# Patient Record
Sex: Female | Born: 1990 | Race: Black or African American | Hispanic: No | Marital: Single | Smoking: Former smoker
Health system: Southern US, Community
[De-identification: ages and names within clinical notes are randomized; demographics above are authoritative.]

## PROBLEM LIST (undated history)

## (undated) DIAGNOSIS — F419 Anxiety disorder, unspecified: Secondary | ICD-10-CM

## (undated) DIAGNOSIS — Z789 Other specified health status: Secondary | ICD-10-CM

## (undated) HISTORY — PX: NO PAST SURGERIES: SHX2092

## (undated) HISTORY — PX: OTHER SURGICAL HISTORY: SHX169

---
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
## 2009-05-29 ENCOUNTER — Emergency Department: Payer: Self-pay | Admitting: Internal Medicine

## 2009-08-25 ENCOUNTER — Emergency Department: Payer: Self-pay | Admitting: Emergency Medicine

## 2009-09-08 ENCOUNTER — Emergency Department: Payer: Self-pay | Admitting: Emergency Medicine

## 2010-05-21 ENCOUNTER — Emergency Department: Payer: Self-pay | Admitting: Internal Medicine

## 2010-09-06 ENCOUNTER — Emergency Department: Payer: Self-pay | Admitting: Emergency Medicine

## 2010-10-13 ENCOUNTER — Emergency Department: Payer: Self-pay | Admitting: Unknown Physician Specialty

## 2011-01-24 ENCOUNTER — Emergency Department: Payer: Self-pay | Admitting: *Deleted

## 2011-06-17 ENCOUNTER — Emergency Department: Payer: Self-pay | Admitting: Emergency Medicine

## 2012-02-29 ENCOUNTER — Emergency Department: Payer: Self-pay | Admitting: Emergency Medicine

## 2012-02-29 LAB — RAPID INFLUENZA A&B ANTIGENS

## 2012-04-18 ENCOUNTER — Encounter (HOSPITAL_COMMUNITY): Payer: Self-pay | Admitting: Emergency Medicine

## 2012-04-18 ENCOUNTER — Emergency Department (HOSPITAL_COMMUNITY)
Admission: EM | Admit: 2012-04-18 | Discharge: 2012-04-18 | Disposition: A | Discharge status: Home / Self Care | Payer: Self-pay | Attending: Emergency Medicine | Admitting: Emergency Medicine

## 2012-04-18 DIAGNOSIS — IMO0001 Reserved for inherently not codable concepts without codable children: Secondary | ICD-10-CM | POA: Insufficient documentation

## 2012-04-18 DIAGNOSIS — R059 Cough, unspecified: Secondary | ICD-10-CM | POA: Insufficient documentation

## 2012-04-18 DIAGNOSIS — H9209 Otalgia, unspecified ear: Secondary | ICD-10-CM | POA: Insufficient documentation

## 2012-04-18 DIAGNOSIS — J069 Acute upper respiratory infection, unspecified: Secondary | ICD-10-CM | POA: Insufficient documentation

## 2012-04-18 DIAGNOSIS — R05 Cough: Secondary | ICD-10-CM | POA: Insufficient documentation

## 2012-04-18 LAB — RAPID STREP SCREEN (MED CTR MEBANE ONLY): Streptococcus, Group A Screen (Direct): NEGATIVE

## 2012-04-18 MED ORDER — IBUPROFEN 200 MG PO TABS
600.0000 mg | ORAL_TABLET | Freq: Once | ORAL | Status: AC
  Administered 2012-04-18: 600 mg via ORAL
  Filled 2012-04-18: qty 3
  Filled 2012-04-18: qty 1

## 2012-04-18 MED ORDER — IBUPROFEN 600 MG PO TABS: 600.0000 mg | ORAL_TABLET | Freq: Four times a day (QID) | ORAL | Status: DC | PRN

## 2012-04-18 NOTE — ED Notes (Signed)
Pt alert, arrives from home, c/o sore throat, onset was earlier today, resp even unlabored, skin pwd, denies recent ill contacts or exposures

## 2012-04-18 NOTE — ED Notes (Signed)
Pt ambulating independently w/ steady gait on d/c in no acute distress, A&Ox4. D/c instructions reviewed w/ pt and family - pt and family deny any further questions or concerns at present. Rx given x1  

## 2012-04-18 NOTE — ED Provider Notes (Signed)
History    This chart was scribed for non-physician practitioner Breanna Crigler, PA working with [name], MD by Magnus Sinning, ED Scribe. This patient was seen in room WTR6/WTR6 and the patient's care was started at 22:40.    CSN: [id number]  Arrival date & time 04/18/12  2211   Chief Complaint  Patient presents with  . Sore Throat    (Consider location/radiation/quality/duration/timing/severity/associated sxs/prior treatment) Patient is a 22 y.o. female presenting with pharyngitis. The history is provided by the patient. No language interpreter was used.  Sore Throat Pertinent negatives include no abdominal pain and no headaches.   Breanna Lawrence is a 22 y.o. female who presents to the Emergency Department complaining of gradually worsening moderate ST, onset this afternoon with associated otalgias, myalgias, and dry unproductive cough.  Pt states she had sudden onset mild otalgias this morning upon waking up, gradually worsening during day along with ST. She states she has not taken any medications and she denies fevers, n/v/d, urinary sxs.  The onset of this condition was acute. The course is constant. Aggravating factors: none. Alleviating factors: none.    History reviewed. No pertinent past medical history.  History reviewed. No pertinent past surgical history.  No family history on file.  History  Substance Use Topics  . Smoking status: Never Smoker   . Smokeless tobacco: Not on file  . Alcohol Use: No    Review of Systems  Constitutional: Negative for fever, chills and fatigue.  HENT: Positive for ear pain and sore throat. Negative for congestion, rhinorrhea, neck stiffness and sinus pressure.   Eyes: Negative for redness.  Respiratory: Positive for cough. Negative for wheezing.   Gastrointestinal: Negative for nausea, vomiting, abdominal pain and diarrhea.  Genitourinary: Negative.   Musculoskeletal: Positive for myalgias.  Skin: Negative for rash.   Neurological: Negative for headaches.  Hematological: Negative for adenopathy.    Allergies  Review of patient's allergies indicates no known allergies.  Home Medications  No current outpatient prescriptions on file.  BP 96/65  Pulse 98  Temp(Src) 99 F (37.2 C) (Oral)  Resp 16  Wt 120 lb (54.432 kg)  SpO2 100%  Physical Exam  Nursing note and vitals reviewed. Constitutional: She is oriented to person, place, and time. She appears well-developed and well-nourished. No distress.  HENT:  Head: Normocephalic and atraumatic. No trismus in the jaw.  Right Ear: Tympanic membrane, external ear and ear canal normal.  Left Ear: Tympanic membrane, external ear and ear canal normal.  Nose: Nose normal. No mucosal edema or rhinorrhea.  Mouth/Throat: Uvula is midline, oropharynx is clear and moist and mucous membranes are normal. Mucous membranes are not dry. No oral lesions. No edematous. No oropharyngeal exudate, posterior oropharyngeal edema, posterior oropharyngeal erythema or tonsillar abscesses.  Mild erythema of the posterior oropharynx.  Eyes: Conjunctivae and EOM are normal. Right eye exhibits no discharge. Left eye exhibits no discharge.  Neck: Normal range of motion. Neck supple. No tracheal deviation present.  No lymphadenopathy   Cardiovascular: Normal rate, regular rhythm and normal heart sounds.   Pulmonary/Chest: Effort normal and breath sounds normal. No respiratory distress. She has no wheezes. She has no rales.  Abdominal: Soft. She exhibits no distension. There is no tenderness.  Musculoskeletal: Normal range of motion.  Lymphadenopathy:    She has no cervical adenopathy.  Neurological: She is alert and oriented to person, place, and time. No sensory deficit.  Skin: Skin is warm and dry.  Psychiatric: She has a normal mood  and affect. Her behavior is normal.    ED Course  Procedures (including critical care time) DIAGNOSTIC STUDIES: Oxygen Saturation is 100% on  room air, normal by my interpretation.    COORDINATION OF CARE: 22:41: Physical exam performed.  Labs Reviewed  RAPID STREP SCREEN   No results found.   1. Upper respiratory infection     Patient seen and examined. Strep neg.  Vital signs reviewed and are as follows: Filed Vitals:   04/18/12 2226  BP: 96/65  Pulse: 98  Temp: 99 F (37.2 C)  Resp: 16   Patient counseled on supportive care for viral URI and s/s to return including worsening symptoms, persistent fever, persistent vomiting, or if they have any other concerns.  Urged to see PCP if symptoms persist for more than 3 days. Patient verbalizes understanding and agrees with plan.     MDM  Patient with symptoms consistent with a viral syndrome.  Vitals are stable, no fever.  No signs of dehydration.  Lung exam normal, no signs of pneumonia.  Supportive therapy indicated with return if symptoms worsen.    I personally performed the services described in this documentation, which was scribed in my presence. The recorded information has been reviewed and is accurate.         Breanna Lawrence, [place] 04/19/12 0100

## 2012-04-19 NOTE — ED Provider Notes (Signed)
Medical screening examination/treatment/procedure(s) were performed by non-physician practitioner and as supervising physician I was immediately available for consultation/collaboration.  [name], MD 04/19/12 1725

## 2012-10-20 ENCOUNTER — Emergency Department: Payer: Self-pay | Admitting: Internal Medicine

## 2013-08-19 ENCOUNTER — Emergency Department: Payer: Self-pay | Admitting: Emergency Medicine

## 2014-02-28 ENCOUNTER — Emergency Department: Payer: Self-pay | Admitting: Emergency Medicine

## 2014-05-02 ENCOUNTER — Emergency Department: Payer: Self-pay | Admitting: Emergency Medicine

## 2014-06-18 ENCOUNTER — Emergency Department
Admit: 2014-06-18 | Disposition: A | Discharge status: Home / Self Care | Payer: Self-pay | Admitting: Physician Assistant

## 2014-06-18 LAB — CBC WITH DIFFERENTIAL/PLATELET
BASOS ABS: 0 10*3/uL (ref 0.0–0.1)
BASOS PCT: 0.3 %
Eosinophil #: 0.1 10*3/uL (ref 0.0–0.7)
Eosinophil %: 1.3 %
HCT: 37.5 % (ref 35.0–47.0)
HGB: 12.5 g/dL (ref 12.0–16.0)
LYMPHS PCT: 16.1 %
Lymphocyte #: 1 10*3/uL (ref 1.0–3.6)
MCH: 29.4 pg (ref 26.0–34.0)
MCHC: 33.2 g/dL (ref 32.0–36.0)
MCV: 89 fL (ref 80–100)
MONOS PCT: 6.4 %
Monocyte #: 0.4 x10 3/mm (ref 0.2–0.9)
NEUTROS ABS: 4.9 10*3/uL (ref 1.4–6.5)
Neutrophil %: 75.9 %
Platelet: 191 10*3/uL (ref 150–440)
RBC: 4.24 10*6/uL (ref 3.80–5.20)
RDW: 14.1 % (ref 11.5–14.5)
WBC: 6.4 10*3/uL (ref 3.6–11.0)

## 2014-06-18 LAB — COMPREHENSIVE METABOLIC PANEL
ALBUMIN: 4.7 g/dL
ALK PHOS: 50 U/L
ANION GAP: 7 (ref 7–16)
BUN: 14 mg/dL
Bilirubin,Total: 0.5 mg/dL
CHLORIDE: 105 mmol/L
CO2: 28 mmol/L
CREATININE: 0.95 mg/dL
Calcium, Total: 9.2 mg/dL
GLUCOSE: 106 mg/dL — AB
Potassium: 3.5 mmol/L
SGOT(AST): 24 U/L
SGPT (ALT): 12 U/L — ABNORMAL LOW
Sodium: 140 mmol/L
TOTAL PROTEIN: 7.7 g/dL

## 2014-06-18 LAB — URINALYSIS, COMPLETE
BILIRUBIN, UR: NEGATIVE
BLOOD: NEGATIVE
Bacteria: NONE SEEN
GLUCOSE, UR: NEGATIVE mg/dL (ref 0–75)
KETONE: NEGATIVE
Leukocyte Esterase: NEGATIVE
NITRITE: NEGATIVE
PH: 5 (ref 4.5–8.0)
SPECIFIC GRAVITY: 1.019 (ref 1.003–1.030)

## 2014-11-24 ENCOUNTER — Emergency Department
Admission: EM | Admit: 2014-11-24 | Discharge: 2014-11-24 | Disposition: A | Discharge status: Home / Self Care | Payer: Self-pay | Attending: Emergency Medicine | Admitting: Emergency Medicine

## 2014-11-24 ENCOUNTER — Encounter: Payer: Self-pay | Admitting: Emergency Medicine

## 2014-11-24 DIAGNOSIS — K529 Noninfective gastroenteritis and colitis, unspecified: Secondary | ICD-10-CM | POA: Insufficient documentation

## 2014-11-24 DIAGNOSIS — Z3202 Encounter for pregnancy test, result negative: Secondary | ICD-10-CM | POA: Insufficient documentation

## 2014-11-24 LAB — URINALYSIS COMPLETE WITH MICROSCOPIC (ARMC ONLY)
Bilirubin Urine: NEGATIVE
Glucose, UA: NEGATIVE mg/dL
Hgb urine dipstick: NEGATIVE
KETONES UR: NEGATIVE mg/dL
Leukocytes, UA: NEGATIVE
Nitrite: NEGATIVE
PH: 5 (ref 5.0–8.0)
PROTEIN: NEGATIVE mg/dL
Specific Gravity, Urine: 1.02 (ref 1.005–1.030)

## 2014-11-24 LAB — COMPREHENSIVE METABOLIC PANEL
ALBUMIN: 4.3 g/dL (ref 3.5–5.0)
ALK PHOS: 51 U/L (ref 38–126)
ALT: 14 U/L (ref 14–54)
AST: 29 U/L (ref 15–41)
Anion gap: 7 (ref 5–15)
BUN: 9 mg/dL (ref 6–20)
CALCIUM: 9.4 mg/dL (ref 8.9–10.3)
CO2: 27 mmol/L (ref 22–32)
CREATININE: 0.82 mg/dL (ref 0.44–1.00)
Chloride: 106 mmol/L (ref 101–111)
GFR calc Af Amer: >60 mL/min (ref >60)
GFR calc non Af Amer: >60 mL/min (ref >60)
GLUCOSE: 114 mg/dL — AB (ref 65–99)
Potassium: 3.4 mmol/L — ABNORMAL LOW (ref 3.5–5.1)
SODIUM: 140 mmol/L (ref 135–145)
Total Bilirubin: 0.8 mg/dL (ref 0.3–1.2)
Total Protein: 7 g/dL (ref 6.5–8.1)

## 2014-11-24 LAB — PREGNANCY, URINE: Preg Test, Ur: NEGATIVE

## 2014-11-24 LAB — CBC WITH DIFFERENTIAL/PLATELET
BASOS PCT: 1 %
Basophils Absolute: 0 10*3/uL (ref 0–0.1)
EOS ABS: 0.1 10*3/uL (ref 0–0.7)
Eosinophils Relative: 1 %
HEMATOCRIT: 35.1 % (ref 35.0–47.0)
HEMOGLOBIN: 11.6 g/dL — AB (ref 12.0–16.0)
LYMPHS ABS: 1.6 10*3/uL (ref 1.0–3.6)
Lymphocytes Relative: 36 %
MCH: 29.2 pg (ref 26.0–34.0)
MCHC: 33 g/dL (ref 32.0–36.0)
MCV: 88.5 fL (ref 80.0–100.0)
Monocytes Absolute: 0.4 10*3/uL (ref 0.2–0.9)
Monocytes Relative: 8 %
NEUTROS ABS: 2.5 10*3/uL (ref 1.4–6.5)
NEUTROS PCT: 54 %
Platelets: 195 10*3/uL (ref 150–440)
RBC: 3.96 MIL/uL (ref 3.80–5.20)
RDW: 13.9 % (ref 11.5–14.5)
WBC: 4.5 10*3/uL (ref 3.6–11.0)

## 2014-11-24 LAB — LIPASE, BLOOD: Lipase: 19 U/L — ABNORMAL LOW (ref 22–51)

## 2014-11-24 MED ORDER — ONDANSETRON HCL 4 MG/2ML IJ SOLN
4.0000 mg | Freq: Once | INTRAMUSCULAR | Status: AC
  Administered 2014-11-24: 4 mg via INTRAVENOUS
  Filled 2014-11-24: qty 2

## 2014-11-24 MED ORDER — SODIUM CHLORIDE 0.9 % IV SOLN
1000.0000 mL | Freq: Once | INTRAVENOUS | Status: AC
  Administered 2014-11-24: 1000 mL via INTRAVENOUS

## 2014-11-24 MED ORDER — PROMETHAZINE HCL 25 MG PO TABS: 25.0000 mg | ORAL_TABLET | Freq: Four times a day (QID) | ORAL | Status: DC | PRN

## 2014-11-24 MED ORDER — MORPHINE SULFATE (PF) 4 MG/ML IV SOLN
4.0000 mg | Freq: Once | INTRAVENOUS | Status: AC
  Administered 2014-11-24: 4 mg via INTRAVENOUS
  Filled 2014-11-24: qty 1

## 2014-11-24 NOTE — Discharge Instructions (Signed)

## 2014-11-24 NOTE — ED Provider Notes (Signed)
[facility] Emergency Department Provider Note     Time seen: ----------------------------------------- 8:33 AM on 11/24/2014 -----------------------------------------    I have reviewed the triage vital signs and the nursing notes.   HISTORY  Chief Complaint Abdominal Pain; Emesis; and Diarrhea    HPI Breanna Lawrence is a 24 y.o. female who presents ER for abdominal pain with nausea vomiting diarrhea since early this morning. Patient states she has been vomiting all night binges had 3 episodes of diarrhea. Having diffuse abdominal pain, nothing makes it better or worse. Patient states she was at work and had to stop working due to the stomach upset. History reviewed. No pertinent past medical history.  There are no active problems to display for this patient.   History reviewed. No pertinent past surgical history.  Allergies Review of patient's allergies indicates no known allergies.  Social History Social History  Substance Use Topics  . Smoking status: Never Smoker   . Smokeless tobacco: None  . Alcohol Use: No    Review of Systems Constitutional: Negative for fever. Eyes: Negative for visual changes. ENT: Negative for sore throat. Cardiovascular: Negative for chest pain. Respiratory: Negative for shortness of breath. Gastrointestinal: Positive for abdominal pain, vomiting and diarrhea. Genitourinary: Negative for dysuria. Musculoskeletal: Negative for back pain. Skin: Negative for rash. Neurological: Negative for headaches, focal weakness or numbness.  10-point ROS otherwise negative.  ____________________________________________   PHYSICAL EXAM:  VITAL SIGNS: ED Triage Vitals  Enc Vitals Group     BP 11/24/14 0832 115/61 mmHg     Pulse Rate 11/24/14 0832 73     Resp 11/24/14 0832 18     Temp 11/24/14 0832 97.6 F (36.4 C)     Temp Source 11/24/14 0832 Oral     SpO2 11/24/14 0832 97 %     Weight 11/24/14 0832 110 lb  (49.896 kg)     Height 11/24/14 0832  (1.651 m)     Head Cir --      Peak Flow --      Pain Score 11/24/14 0827 9     Pain Loc --      Pain Edu? --      Excl. in GC? --     Constitutional: Alert and oriented. Mild distress Eyes: Conjunctivae are normal. PERRL. Normal extraocular movements. ENT   Head: Normocephalic and atraumatic.   Nose: No congestion/rhinnorhea.   Mouth/Throat: Mucous membranes are moist.   Neck: No stridor. Cardiovascular: Normal rate, regular rhythm. Normal and symmetric distal pulses are present in all extremities. No murmurs, rubs, or gallops. Respiratory: Normal respiratory effort without tachypnea nor retractions. Breath sounds are clear and equal bilaterally. No wheezes/rales/rhonchi. Gastrointestinal: Nonfocal Abdominal tenderness, no rebound or guarding. Normal bowel sounds. Musculoskeletal: Nontender with normal range of motion in all extremities. No joint effusions.  No lower extremity tenderness nor edema. Neurologic:  Normal speech and language. No gross focal neurologic deficits are appreciated. Speech is normal. No gait instability. Skin:  Skin is warm, dry and intact. No rash noted. Psychiatric: Mood and affect are normal. Speech and behavior are normal. Patient exhibits appropriate insight and judgment. ____________________________________________  ED COURSE:  Pertinent labs & imaging results that were available during my care of the patient were reviewed by me and considered in my medical decision making (see chart for details). Patient is in no acute distress, likely gastroenteritis. We'll give fluids, antiemetics and pain medication. ____________________________________________    LABS (pertinent positives/negatives)  Labs Reviewed  CBC  WITH DIFFERENTIAL/PLATELET - Abnormal; Notable for the following:    Hemoglobin 11.6 (*)    All other components within normal limits  COMPREHENSIVE METABOLIC PANEL - Abnormal; Notable for  the following:    Potassium 3.4 (*)    Glucose, Bld 114 (*)    All other components within normal limits  LIPASE, BLOOD - Abnormal; Notable for the following:    Lipase 19 (*)    All other components within normal limits  URINALYSIS COMPLETEWITH MICROSCOPIC ([facility] ONLY) - Abnormal; Notable for the following:    Color, Urine YELLOW (*)    APPearance HAZY (*)    Bacteria, UA RARE (*)    Squamous Epithelial / LPF 6-30 (*)    All other components within normal limits  PREGNANCY, URINE    ____________________________________________  FINAL ASSESSMENT AND PLAN  Gastroenteritis  Plan: Patient with labs as dictated above. Patient feeling better after morphine and Zofran. She'll be discharged with Phenergan to take as needed. She is given a work note for 24 hours.   Emily Filbert, MD   Emily Filbert, MD 11/24/14 [phone]

## 2014-11-24 NOTE — ED Notes (Signed)
Patient to ER for c/o upper to mid abdominal pain with N/V/D since early this am.

## 2015-04-05 ENCOUNTER — Emergency Department
Admission: EM | Admit: 2015-04-05 | Discharge: 2015-04-05 | Disposition: A | Discharge status: Home / Self Care | Payer: Self-pay | Attending: Emergency Medicine | Admitting: Emergency Medicine

## 2015-04-05 DIAGNOSIS — R21 Rash and other nonspecific skin eruption: Secondary | ICD-10-CM | POA: Insufficient documentation

## 2015-04-05 NOTE — ED Notes (Signed)
Pt in with red area to left upper arm, pt unsure of insect bite.

## 2015-04-20 ENCOUNTER — Encounter (HOSPITAL_COMMUNITY): Payer: Self-pay | Admitting: Emergency Medicine

## 2015-04-20 ENCOUNTER — Emergency Department (HOSPITAL_COMMUNITY)
Admission: EM | Admit: 2015-04-20 | Discharge: 2015-04-20 | Disposition: A | Discharge status: Home / Self Care | Payer: BLUE CROSS/BLUE SHIELD | Source: Home / Self Care | Attending: Family Medicine | Admitting: Family Medicine

## 2015-04-20 ENCOUNTER — Emergency Department (INDEPENDENT_AMBULATORY_CARE_PROVIDER_SITE_OTHER): Payer: BLUE CROSS/BLUE SHIELD

## 2015-04-20 DIAGNOSIS — S90121A Contusion of right lesser toe(s) without damage to nail, initial encounter: Secondary | ICD-10-CM

## 2015-04-20 MED ORDER — TRAMADOL-ACETAMINOPHEN 37.5-325 MG PO TABS: 1.0000 | ORAL_TABLET | Freq: Four times a day (QID) | ORAL | Status: DC | PRN

## 2015-04-20 NOTE — ED Provider Notes (Signed)
CSN: [id number]     Arrival date & time 04/20/15  1512 History   First MD Initiated Contact with Patient 04/20/15 1637     Chief Complaint  Patient presents with  . Foot Pain   (Consider location/radiation/quality/duration/timing/severity/associated sxs/prior Treatment) HPI Comments: 25 year old female was ascending steps last night and accidentally jammed her right great toe into the wall of the step. She woke this morning with tenderness, minor swelling and ecchymosis to the right great toe involving the IP joint and distally.   History reviewed. No pertinent past medical history. History reviewed. No pertinent past surgical history. No family history on file. Social History  Substance Use Topics  . Smoking status: Never Smoker   . Smokeless tobacco: None  . Alcohol Use: No   OB History    No data available     Review of Systems  Constitutional: Negative for fever, chills and activity change.  HENT: Negative.   Respiratory: Negative.   Cardiovascular: Negative.   Musculoskeletal:       As per HPI  Skin: Negative for color change, pallor and rash.  Neurological: Negative.     Allergies  Review of patient's allergies indicates no known allergies.  Home Medications   Prior to Admission medications   Medication Sig Start Date End Date Taking? Authorizing Provider  ibuprofen (ADVIL,MOTRIN) 600 MG tablet Take 1 tablet (600 mg total) by mouth every 6 (six) hours as needed for pain. 04/18/12   [name], PA-C  promethazine (PHENERGAN) 25 MG tablet Take 1 tablet (25 mg total) by mouth every 6 (six) hours as needed for nausea or vomiting. Patient not taking: Reported on 04/20/2015 11/24/14   Emily Filbert, MD  traMADol-acetaminophen (ULTRACET) 37.5-325 MG tablet Take 1 tablet by mouth every 6 (six) hours as needed. 04/20/15   Hayden Rasmussen, NP   Meds Ordered and Administered this Visit  Medications - No data to display  BP 112/74 mmHg  Pulse 91  Temp(Src) 98.6 F (37  C) (Oral)  Resp 16  SpO2 100%  LMP 04/04/2015 No data found.   Physical Exam  Constitutional: She is oriented to person, place, and time. She appears well-developed and well-nourished. No distress.  Eyes: EOM are normal.  Neck: Normal range of motion. Neck supple.  Cardiovascular: Normal rate.   Pulmonary/Chest: Effort normal. No respiratory distress.  Musculoskeletal: She exhibits no edema.  Tenderness to the right great toe at the IP joint and distally. Mild swelling and ecchymosis. Extension is complete flexion is limited due to pain. No deformities. Distal sensation is intact.  Neurological: She is alert and oriented to person, place, and time. She exhibits normal muscle tone.  Skin: Skin is warm and dry.  Psychiatric: She has a normal mood and affect.  Nursing note and vitals reviewed.   ED Course  Procedures (including critical care time)  Labs Review Labs Reviewed - No data to display  Imaging Review Dg Toe Great Right  04/20/2015  CLINICAL DATA:  Patient stumped the right great toe last night going up steps. Pain and swelling. EXAM: RIGHT GREAT TOE COMPARISON:  None. FINDINGS: There is no evidence of fracture or dislocation. There is no evidence of arthropathy or other focal bone abnormality. Soft tissues are unremarkable. IMPRESSION: Negative. Electronically Signed   By: Norva Pavlov M.D.   On: 04/20/2015 17:04     Visual Acuity Review  Right Eye Distance:   Left Eye Distance:   Bilateral Distance:    Right Eye Near:  Left Eye Near:    Bilateral Near:         MDM   1. Toe contusion, right, initial encounter    Ultracet and motrin 600 mg for pain Ice, and elevation Tape toes together.     Hayden Rasmussen, NP 04/20/15 1734

## 2015-04-20 NOTE — ED Notes (Signed)
Ice pack

## 2015-04-20 NOTE — ED Notes (Signed)
Patient transported to X-ray 

## 2015-04-20 NOTE — ED Notes (Signed)
Patient reports running up stairs barefoot last night.  In the process, jammed right great toe. Toe is swollen, painful and has visible bruising around the toe, on top and underneath

## 2015-04-20 NOTE — Discharge Instructions (Signed)
Contusion Ultracet and motrin 600 mg for pain Ice, and elevation Tape toes together. A contusion is a deep bruise. Contusions happen when an injury causes bleeding under the skin. Symptoms of bruising include pain, swelling, and discolored skin. The skin may turn blue, purple, or yellow. HOME CARE   Rest the injured area.  If told, put ice on the injured area.  Put ice in a plastic bag.  Place a towel between your skin and the bag.  Leave the ice on for 20 minutes, 2-3 times per day.  If told, put light pressure (compression) on the injured area using an elastic bandage. Make sure the bandage is not too tight. Remove it and put it back on as told by your doctor.  If possible, raise (elevate) the injured area above the level of your heart while you are sitting or lying down.  Take over-the-counter and prescription medicines only as told by your doctor. GET HELP IF:  Your symptoms do not get better after several days of treatment.  Your symptoms get worse.  You have trouble moving the injured area. GET HELP RIGHT AWAY IF:   You have very bad pain.  You have a loss of feeling (numbness) in a hand or foot.  Your hand or foot turns pale or cold.   This information is not intended to replace advice given to you by your health care provider. Make sure you discuss any questions you have with your health care provider.   Document Released: 08/07/2007 Document Revised: 11/09/2014 Document Reviewed: 07/06/2014 Elsevier Interactive Patient Education [employer]

## 2015-04-20 NOTE — ED Notes (Signed)
Patient requesting pain medicine, notified Hayden Rasmussen, NP

## 2015-05-13 ENCOUNTER — Ambulatory Visit (INDEPENDENT_AMBULATORY_CARE_PROVIDER_SITE_OTHER): Payer: BLUE CROSS/BLUE SHIELD | Admitting: Family Medicine

## 2015-05-13 VITALS — BP 114/82 | HR 80 | Temp 98.4°F | Resp 18

## 2015-05-13 DIAGNOSIS — R109 Unspecified abdominal pain: Secondary | ICD-10-CM | POA: Diagnosis not present

## 2015-05-13 DIAGNOSIS — R112 Nausea with vomiting, unspecified: Secondary | ICD-10-CM | POA: Diagnosis not present

## 2015-05-13 DIAGNOSIS — K529 Noninfective gastroenteritis and colitis, unspecified: Secondary | ICD-10-CM

## 2015-05-13 LAB — COMPREHENSIVE METABOLIC PANEL
ALBUMIN: 4.5 g/dL (ref 3.6–5.1)
ALT: 15 U/L (ref 6–29)
AST: 24 U/L (ref 10–30)
Alkaline Phosphatase: 53 U/L (ref 33–115)
BUN: 12 mg/dL (ref 7–25)
CALCIUM: 9.7 mg/dL (ref 8.6–10.2)
CHLORIDE: 104 mmol/L (ref 98–110)
CO2: 23 mmol/L (ref 20–31)
Creat: 0.8 mg/dL (ref 0.50–1.10)
Glucose, Bld: 99 mg/dL (ref 65–99)
POTASSIUM: 4 mmol/L (ref 3.5–5.3)
Sodium: 139 mmol/L (ref 135–146)
TOTAL PROTEIN: 7.5 g/dL (ref 6.1–8.1)
Total Bilirubin: 0.4 mg/dL (ref 0.2–1.2)

## 2015-05-13 LAB — POCT URINALYSIS DIP (MANUAL ENTRY)
BILIRUBIN UA: NEGATIVE
Blood, UA: NEGATIVE
Glucose, UA: NEGATIVE
Leukocytes, UA: NEGATIVE
Nitrite, UA: NEGATIVE
PROTEIN UA: NEGATIVE
SPEC GRAV UA: 1.025
Urobilinogen, UA: 0.2
pH, UA: 6

## 2015-05-13 LAB — POCT CBC
GRANULOCYTE PERCENT: 86.4 % — AB (ref 37–80)
HEMATOCRIT: 36.6 % — AB (ref 37.7–47.9)
Hemoglobin: 13.1 g/dL (ref 12.2–16.2)
LYMPH, POC: 0.9 (ref 0.6–3.4)
MCH, POC: 31.3 pg — AB (ref 27–31.2)
MCHC: 35.7 g/dL — AB (ref 31.8–35.4)
MCV: 87.6 fL (ref 80–97)
MID (CBC): 0.5 (ref 0–0.9)
MPV: 9.1 fL (ref 0–99.8)
POC GRANULOCYTE: 9.3 — AB (ref 2–6.9)
POC LYMPH %: 8.6 % — AB (ref 10–50)
POC MID %: 5 % (ref 0–12)
Platelet Count, POC: 169 10*3/uL (ref 142–424)
RBC: 4.18 M/uL (ref 4.04–5.48)
RDW, POC: 13.7 %
WBC: 10.8 10*3/uL — AB (ref 4.6–10.2)

## 2015-05-13 LAB — POC MICROSCOPIC URINALYSIS (UMFC)

## 2015-05-13 LAB — LIPASE: LIPASE: 17 U/L (ref 7–60)

## 2015-05-13 MED ORDER — PROMETHAZINE HCL 25 MG/ML IJ SOLN
25.0000 mg | Freq: Once | INTRAMUSCULAR | Status: AC
  Administered 2015-05-13: 25 mg via INTRAMUSCULAR

## 2015-05-13 MED ORDER — ONDANSETRON 4 MG PO TBDP
8.0000 mg | ORAL_TABLET | Freq: Once | ORAL | Status: AC
  Administered 2015-05-13: 8 mg via ORAL

## 2015-05-13 MED ORDER — ONDANSETRON 8 MG PO TBDP: 8.0000 mg | ORAL_TABLET | Freq: Three times a day (TID) | ORAL | Status: DC | PRN

## 2015-05-13 NOTE — Patient Instructions (Addendum)
Because you received labwork today, you will receive an invoice from [employer]. Please contact [facility] at [phone] with questions or concerns regarding your invoice. Our billing staff will not be able to assist you with those questions.  You will be contacted with the lab results as soon as they are available. The fastest way to get your results is to activate your My Chart account. Instructions are located on the last page of this paperwork. If you have not heard from [place] regarding the results in 2 weeks, please contact this office.  Viral Gastroenteritis Viral gastroenteritis is also known as stomach flu. This condition affects the stomach and intestinal tract. It can cause sudden diarrhea and vomiting. The illness typically lasts 3 to 8 days. Most people develop an immune response that eventually gets rid of the virus. While this natural response develops, the virus can make you quite ill. CAUSES  Many different viruses can cause gastroenteritis, such as rotavirus or noroviruses. You can catch one of these viruses by consuming contaminated food or water. You may also catch a virus by sharing utensils or other personal items with an infected person or by touching a contaminated surface. SYMPTOMS  The most common symptoms are diarrhea and vomiting. These problems can cause a severe loss of body fluids (dehydration) and a body salt (electrolyte) imbalance. Other symptoms may include:  Fever.  Headache.  Fatigue.  Abdominal pain. DIAGNOSIS  Your caregiver can usually diagnose viral gastroenteritis based on your symptoms and a physical exam. A stool sample may also be taken to test for the presence of viruses or other infections. TREATMENT  This illness typically goes away on its own. Treatments are aimed at rehydration. The most serious cases of viral gastroenteritis involve vomiting so severely that you are not able to keep fluids down. In these cases, fluids must  be given through an intravenous line (IV). HOME CARE INSTRUCTIONS   Drink enough fluids to keep your urine clear or pale yellow. Drink small amounts of fluids frequently and increase the amounts as tolerated.  Ask your caregiver for specific rehydration instructions.  Avoid:  Foods high in sugar.  Alcohol.  Carbonated drinks.  Tobacco.  Juice.  Caffeine drinks.  Extremely hot or cold fluids.  Fatty, greasy foods.  Too much intake of anything at one time.  Dairy products until 24 to 48 hours after diarrhea stops.  You may consume probiotics. Probiotics are active cultures of beneficial bacteria. They may lessen the amount and number of diarrheal stools in adults. Probiotics can be found in yogurt with active cultures and in supplements.  Wash your hands well to avoid spreading the virus.  Only take over-the-counter or prescription medicines for pain, discomfort, or fever as directed by your caregiver. Do not give aspirin to children. Antidiarrheal medicines are not recommended.  Ask your caregiver if you should continue to take your regular prescribed and over-the-counter medicines.  Keep all follow-up appointments as directed by your caregiver. SEEK IMMEDIATE MEDICAL CARE IF:   You are unable to keep fluids down.  You do not urinate at least once every 6 to 8 hours.  You develop shortness of breath.  You notice blood in your stool or vomit. This may look like coffee grounds.  You have abdominal pain that increases or is concentrated in one small area (localized).  You have persistent vomiting or diarrhea.  You have a fever.  The patient is a child younger than 3 months, and he or she  has a fever.  The patient is a child older than 3 months, and he or she has a fever and persistent symptoms.  The patient is a child older than 3 months, and he or she has a fever and symptoms suddenly get worse.  The patient is a baby, and he or she has no tears when  crying. MAKE SURE YOU:   Understand these instructions.  Will watch your condition.  Will get help right away if you are not doing well or get worse.   This information is not intended to replace advice given to you by your health care provider. Make sure you discuss any questions you have with your health care provider.   Document Released: 02/18/2005 Document Revised: 05/13/2011 Document Reviewed: 12/05/2010 Elsevier Interactive Patient Education [employer]

## 2015-05-13 NOTE — Progress Notes (Signed)
Subjective:   By signing my name below, I, [name], attest that this documentation has been prepared under the direction and in the presence of [name], MD.  Electronically Signed: Andrew Auaven Small, ED Scribe. 05/13/2015. 10:26 AM.    Patient ID: Breanna Lawrence, female    DOB: 1990/04/10, 25 y.o.   MRN: [id number]  HPI   Chief Complaint  Patient presents with  . Emesis    today, unable to keep anything down  . Chills    HPI Comments: Breanna [place] is a 25 y.o. female who presents to the [facility] complaining of constant emesis that began this morning. Pt states she felt fine yesterday with no symptoms. For dinner last night she had pizza, but no suspicious foods. She drank some ginger ail this morning but no solid foods.  She reports associated abdominal pain, nausea, light headedness and dizziness. She was given pepto bismol this morning at work but vomited medication.  She denies constipation, dysuria, vaginal discharge, trouble with bowels. She denies chance of pregnancy. She is not on birth control. Pt states she is not sexually active.   There are no active problems to display for this patient.  No past medical history on file. No past surgical history on file. No Known Allergies Prior to Admission medications   Medication Sig Start Date End Date Taking? Authorizing Provider  ibuprofen (ADVIL,MOTRIN) 600 MG tablet Take 1 tablet (600 mg total) by mouth every 6 (six) hours as needed for pain. Patient not taking: Reported on 05/13/2015 04/18/12   Renne CriglerJoshua Geiple, PA-C  promethazine (PHENERGAN) 25 MG tablet Take 1 tablet (25 mg total) by mouth every 6 (six) hours as needed for nausea or vomiting. Patient not taking: Reported on 04/20/2015 11/24/14   Emily FilbertJonathan E Williams, MD  traMADol-acetaminophen (ULTRACET) 37.5-325 MG tablet Take 1 tablet by mouth every 6 (six) hours as needed. Patient not taking: Reported on 05/13/2015 04/20/15   Hayden Rasmussenavid Mabe, NP   Social  History   Social History  . Marital Status: Single    Spouse Name: N/A  . Number of Children: N/A  . Years of Education: N/A   Occupational History  . Not on file.   Social History Main Topics  . Smoking status: Current Every Day Smoker  . Smokeless tobacco: Not on file  . Alcohol Use: No  . Drug Use: Not on file  . Sexual Activity: Not on file   Other Topics Concern  . Not on file   Social History Narrative   Review of Systems  Constitutional: Positive for chills and appetite change.  Gastrointestinal: Positive for nausea, vomiting and abdominal pain. Negative for diarrhea, constipation and blood in stool.  Genitourinary: Negative for dysuria, vaginal discharge and difficulty urinating.  Neurological: Negative for dizziness and light-headedness.     Objective:   Physical Exam  Constitutional: She is oriented to person, place, and time. She appears well-developed and well-nourished. No distress.  HENT:  Head: Normocephalic and atraumatic.  Eyes: Conjunctivae and EOM are normal.  Neck: Neck supple.  Cardiovascular: Normal rate.   Pulmonary/Chest: Effort normal.  Abdominal: Bowel sounds are increased. There is generalized tenderness. There is guarding.  Diaphoretic   Musculoskeletal: Normal range of motion.  Neurological: She is alert and oriented to person, place, and time.  Skin: Skin is warm and dry.  Psychiatric: She has a normal mood and affect. Her behavior is normal.  Nursing note and vitals reviewed.   Filed Vitals:  05/13/15 0942  BP: 114/82  Pulse: 80  Temp: 98.4 F (36.9 C)  Resp: 18  SpO2: 98%   Results for orders placed or performed in visit on 05/13/15  POCT urinalysis dipstick  Result Value Ref Range   Color, UA yellow yellow   Clarity, UA hazy (A) clear   Glucose, UA negative negative   Bilirubin, UA negative negative   Ketones, POC UA trace (5) (A) negative   Spec Grav, UA 1.025    Blood, UA negative negative   pH, UA 6.0    Protein  Ur, POC negative negative   Urobilinogen, UA 0.2    Nitrite, UA Negative Negative   Leukocytes, UA Negative Negative  POCT Microscopic Urinalysis ([facility])  Result Value Ref Range   WBC,UR,HPF,POC None None WBC/hpf   RBC,UR,HPF,POC None None RBC/hpf   Bacteria Moderate (A) None, Too numerous to count   Mucus Present (A) Absent   Epithelial Cells, UR Per Microscopy Many (A) None, Too numerous to count cells/hpf  POCT CBC  Result Value Ref Range   WBC 10.8 (A) 4.6 - 10.2 K/uL   Lymph, poc 0.9 0.6 - 3.4   POC LYMPH PERCENT 8.6 (A) 10 - 50 %L   MID (cbc) 0.5 0 - 0.9   POC MID % 5.0 0 - 12 %M   POC Granulocyte 9.3 (A) 2 - 6.9   Granulocyte percent 86.4 (A) 37 - 80 %G   RBC 4.18 4.04 - 5.48 M/uL   Hemoglobin 13.1 12.2 - 16.2 g/dL   HCT, POC 04.5 (A) 40.9 - 47.9 %   MCV 87.6 80 - 97 fL   MCH, POC 31.3 (A) 27 - 31.2 pg   MCHC 35.7 (A) 31.8 - 35.4 g/dL   RDW, POC 81.1 %   Platelet Count, POC 169 142 - 424 K/uL   MPV 9.1 0 - 99.8 fL    10:43 AM- IV placement 11:00 AM- pt was unable to keep zofran down. She feels the need to urinate. Phenergan injection ordered.  11:27 AM-  Some improvement with nausea. She still has abdominal tenderness over suprapubic area and LLQ. CVA tenderness. 11:36 AM- labs discussed with patient  Assessment & Plan:   1. Abdominal pain, unspecified abdominal location   2. Nausea and vomiting, intractability of vomiting not specified, unspecified vomiting type   3. Gastroenteritis     Orders Placed This Encounter  Procedures  . Urine culture  . Comprehensive metabolic panel  . Lipase  . POCT urinalysis dipstick  . POCT Microscopic Urinalysis ([facility])  . POCT CBC    Meds ordered this encounter  Medications  . ondansetron (ZOFRAN-ODT) disintegrating tablet 8 mg    Sig:   . promethazine (PHENERGAN) injection 25 mg    Sig:   . ondansetron (ZOFRAN-ODT) 8 MG disintegrating tablet    Sig: Take 1 tablet (8 mg total) by mouth every 8 (eight) hours as needed  for nausea.    Dispense:  14 tablet    Refill:  0    I personally performed the services described in this documentation, which was scribed in my presence. The recorded information has been reviewed and considered, and addended by me as needed.  Norberto Sorenson, MD MPH

## 2015-05-14 LAB — URINE CULTURE: Colony Count: 30000

## 2017-02-18 ENCOUNTER — Ambulatory Visit
Admission: EM | Admit: 2017-02-18 | Discharge: 2017-02-18 | Disposition: A | Discharge status: Home / Self Care | Payer: BLUE CROSS/BLUE SHIELD | Attending: Family Medicine | Admitting: Family Medicine

## 2017-02-18 ENCOUNTER — Encounter: Payer: Self-pay | Admitting: Emergency Medicine

## 2017-02-18 ENCOUNTER — Other Ambulatory Visit: Payer: Self-pay

## 2017-02-18 ENCOUNTER — Telehealth: Payer: Self-pay

## 2017-02-18 DIAGNOSIS — R112 Nausea with vomiting, unspecified: Secondary | ICD-10-CM | POA: Diagnosis not present

## 2017-02-18 DIAGNOSIS — R197 Diarrhea, unspecified: Secondary | ICD-10-CM | POA: Diagnosis not present

## 2017-02-18 DIAGNOSIS — B349 Viral infection, unspecified: Secondary | ICD-10-CM | POA: Diagnosis not present

## 2017-02-18 DIAGNOSIS — J029 Acute pharyngitis, unspecified: Secondary | ICD-10-CM

## 2017-02-18 HISTORY — DX: Other specified health status: Z78.9

## 2017-02-18 LAB — RAPID STREP SCREEN (MED CTR MEBANE ONLY): STREPTOCOCCUS, GROUP A SCREEN (DIRECT): NEGATIVE

## 2017-02-18 MED ORDER — ONDANSETRON 4 MG PO TBDP: 4.0000 mg | ORAL_TABLET | Freq: Three times a day (TID) | ORAL | 0 refills | Status: DC | PRN

## 2017-02-18 MED ORDER — BENZONATATE 100 MG PO CAPS: 100.0000 mg | ORAL_CAPSULE | Freq: Three times a day (TID) | ORAL | 0 refills | Status: DC | PRN

## 2017-02-18 MED ORDER — ONDANSETRON 4 MG PO TBDP
4.0000 mg | ORAL_TABLET | Freq: Once | ORAL | Status: AC
  Administered 2017-02-18: 4 mg via ORAL

## 2017-02-18 NOTE — ED Provider Notes (Signed)
[facility] ____________________________________________  Time seen: Approximately 1145 AM  I have reviewed the triage vital signs and the nursing notes.   HISTORY  Chief Complaint Sore Throat  HPI Breanna Lawrence is a 26 y.o. female presenting for evaluation of 2 days of runny nose, nasal congestion, sore throat, cough with episodes of diarrhea last night totaling 3 and one episode of vomiting this morning.  Reports some chills and body aches, denies known fevers.  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  Reports mother recently sick with some similar complaints.  States sore throat is mild at this time.  Denies abnormal colored stool or vomit.  Has tolerated some food and fluids this morning.  Denies abdominal pain.  Denies dysuria, rash, chest pain, shortness of breath.  No over-the-counter medications taken today for the same complaints.  Denies other aggravating or alleviating factors.  Reports otherwise feels well. Denies recent sickness. Denies recent antibiotic use.   Patient's last menstrual period was 01/21/2017.Denies pregnancy.    Past Medical History:  Diagnosis Date  . No known health problems     There are no active problems to display for this patient.   History reviewed. No pertinent surgical history.   No current facility-administered medications for this encounter.   Current Outpatient Medications:  .  benzonatate (TESSALON PERLES) 100 MG capsule, Take 1 capsule (100 mg total) by mouth 3 (three) times daily as needed for cough., Disp: 15 capsule, Rfl: 0 .  ondansetron (ZOFRAN ODT) 4 MG disintegrating tablet, Take 1 tablet (4 mg total) by mouth every 8 (eight) hours as needed for nausea or vomiting., Disp: 15 tablet, Rfl: 0  Allergies Patient has no known allergies.  Family History  Problem Relation Age of Onset  . Hypertension Mother   . Healthy Father     Social History Social History   Tobacco Use  . Smoking status: Current Every Day  Smoker  . Smokeless tobacco: Never Used  Substance Use Topics  . Alcohol use: No    Alcohol/week: 0.0 oz  . Drug use: No    Review of Systems Constitutional: As above.  ENT: AS above.  Cardiovascular: Denies chest pain. Respiratory: Denies shortness of breath. Gastrointestinal: As above.  Genitourinary: Negative for dysuria. Musculoskeletal: Negative for back pain. Skin: Negative for rash.  ____________________________________________   PHYSICAL EXAM:  VITAL SIGNS: ED Triage Vitals [02/18/17 1110]  Enc Vitals Group     BP 106/70     Pulse Rate 73     Resp 16     Temp 98.1 F (36.7 C)     Temp Source Oral     SpO2 98 %     Weight 117 lb (53.1 kg)     Height 5\' 4"  (1.626 m)     Head Circumference      Peak Flow      Pain Score 8     Pain Loc      Pain Edu?      Excl. in GC?     Constitutional: Alert and oriented. Well appearing and in no acute distress. Eyes: Conjunctivae are normal.  Head: Atraumatic. No sinus tenderness to palpation. No swelling. No erythema.  Ears: no erythema, normal TMs bilaterally.   Nose:Nasal congestion   Mouth/Throat: Mucous membranes are moist. No pharyngeal erythema. No tonsillar swelling or exudate.  Neck: No stridor.  No cervical spine tenderness to palpation. Hematological/Lymphatic/Immunilogical: No cervical lymphadenopathy. Cardiovascular: Normal rate, regular rhythm. Grossly normal heart sounds.  Good peripheral  circulation. Respiratory: Normal respiratory effort.  No retractions. No wheezes, rales or rhonchi. Good air movement.  Gastrointestinal: Soft and nontender. Normal Bowel sounds. Musculoskeletal: Ambulatory with steady gait. No cervical, thoracic or lumbar tenderness to palpation. Neurologic:  Normal speech and language. No gait instability. Skin:  Skin appears warm, dry and intact. No rash noted. Psychiatric: Mood and affect are normal. Speech and behavior are normal.  ___________________________________________     LABS (all labs ordered are listed, but only abnormal results are displayed)  Labs Reviewed  [facility]ID [facility]RE[facility]CREEN (NOT [facility])  CULTURE, GROUP A STREP [facility])   RADIOLOGY  No results found. ____________________________________________   PROCEDURES Procedures    INITIAL IMPRESSION / ASSESSMENT AND PLAN / ED COURSE  Pertinent labs & imaging results that were available during my care of the patient were reviewed by me and considered in my medical decision making (see chart for details).  Well-appearing patient.  No acute distress.  4 mg ODT Zofran given once in urgent care.  Suspect viral illness of viral gastroenteritis.  Will treat supportively with as needed Zofran and Tessalon Perles.  Discussed over-the-counter decongestants.  Encourage rest, fluids, supportive care.  Work note given for today and tomorrow.Discussed indication, risks and benefits of medications with patient.  Discussed follow up with Primary care physician this week. Discussed follow up and return parameters including no resolution or any worsening concerns. Patient verbalized understanding and agreed to plan.   ____________________________________________   FINAL CLINICAL IMPRESSION(S) / ED DIAGNOSES  Final diagnoses:  Viral illness  Nausea vomiting and diarrhea     ED Discharge Orders        Ordered    ondansetron (ZOFRAN ODT) 4 MG disintegrating tablet  Every 8 hours PRN,   Status:  Discontinued     02/18/17 1202    benzonatate (TESSALON PERLES) 100 MG capsule  3 times daily PRN,   Status:  Discontinued     02/18/17 1202       Note: This dictation was prepared with Dragon dictation along with smaller phrase technology. Any transcriptional errors that result from this process are unintentional.         Renford DillsMiller, Enola Siebers, NP 02/18/17 1324

## 2017-02-18 NOTE — ED Triage Notes (Signed)
Patient in today c/o 3 day history of nasal congestion, sore throat x 1 day and diarrhea last night and this morning. Some vomiting this morning as well. Patient states she has felt hot, but has not checked her temperature. Patient has tried OTC throat lozenges.

## 2017-02-18 NOTE — Discharge Instructions (Signed)
Take medication as prescribed. Rest. Drink plenty of fluids.  ° °Follow up with your primary care physician this week as needed. Return to Urgent care for new or worsening concerns.  ° °

## 2017-02-21 LAB — CULTURE, GROUP A STREP (THRC)

## 2017-02-28 ENCOUNTER — Telehealth: Payer: Self-pay | Admitting: Emergency Medicine

## 2017-02-28 MED ORDER — AMOXICILLIN 500 MG PO CAPS: 500.0000 mg | ORAL_CAPSULE | Freq: Two times a day (BID) | ORAL | 0 refills | Status: AC

## 2017-02-28 NOTE — Telephone Encounter (Signed)
Patient called re: letter she received re: lab results. Patient notified of results. Patient states she is not feeling completely well. RX sent in for Amox 500mg  bid x 10 day per Dr. Ria ClockLaura Murray. Patient to follow up with PCP with not improving after 3 days on abx per Dr. Dayton ScrapeMurray. Patient agreed and vu.

## 2017-03-06 ENCOUNTER — Other Ambulatory Visit: Payer: Self-pay

## 2017-03-06 ENCOUNTER — Encounter: Payer: Self-pay | Admitting: Gynecology

## 2017-03-06 ENCOUNTER — Ambulatory Visit
Admission: EM | Admit: 2017-03-06 | Discharge: 2017-03-06 | Disposition: A | Discharge status: Nursing Home (Includes ICF) | Payer: BLUE CROSS/BLUE SHIELD | Attending: Internal Medicine | Admitting: Internal Medicine

## 2017-03-06 DIAGNOSIS — M5489 Other dorsalgia: Secondary | ICD-10-CM | POA: Diagnosis not present

## 2017-03-06 DIAGNOSIS — A879 Viral meningitis, unspecified: Secondary | ICD-10-CM | POA: Diagnosis not present

## 2017-03-06 DIAGNOSIS — A599 Trichomoniasis, unspecified: Secondary | ICD-10-CM | POA: Diagnosis not present

## 2017-03-06 LAB — URINALYSIS, COMPLETE (UACMP) WITH MICROSCOPIC
Bacteria, UA: NONE SEEN
Bilirubin Urine: NEGATIVE
Glucose, UA: NEGATIVE mg/dL
Hgb urine dipstick: NEGATIVE
Ketones, ur: NEGATIVE mg/dL
Nitrite: NEGATIVE
Protein, ur: NEGATIVE mg/dL
RBC / HPF: NONE SEEN RBC/hpf (ref 0–5)
Specific Gravity, Urine: 1.015 (ref 1.005–1.030)
pH: 7.5 (ref 5.0–8.0)

## 2017-03-06 MED ORDER — METRONIDAZOLE 500 MG PO TABS: 2000.0000 mg | ORAL_TABLET | Freq: Three times a day (TID) | ORAL | 0 refills | Status: DC

## 2017-03-06 NOTE — ED Provider Notes (Addendum)
[facility]    CSN: [id number] Arrival date & time: 03/06/17  1927     History   Chief Complaint Chief Complaint  Patient presents with  . Cough  . Back Pain  . Sore Throat  . Urinary Tract Infection    HPI Breanna Lawrence is a 27 y.o. female.   27 yo female c/o headache, eye pain/lacrimation, back pain/soreness x15 days.  Seen recent and dx w/ strep throat. Abx did not help.       Past Medical History:  Diagnosis Date  . No known health problems     There are no active problems to display for this patient.   History reviewed. No pertinent surgical history.  OB History    No data available       Home Medications    Prior to Admission medications   Medication Sig Start Date End Date Taking? Authorizing Provider  amoxicillin (AMOXIL) 500 MG capsule Take 1 capsule (500 mg total) by mouth 2 (two) times daily for 10 days. 02/28/17 03/10/17  Payton Mccallumonty, [place], MD  benzonatate (TESSALON PERLES) 100 MG capsule Take 1 capsule (100 mg total) by mouth 3 (three) times daily as needed for cough. 02/18/17   Renford DillsMiller, Lindsey, NP  ondansetron (ZOFRAN ODT) 4 MG disintegrating tablet Take 1 tablet (4 mg total) by mouth every 8 (eight) hours as needed for nausea or vomiting. 02/18/17   Renford DillsMiller, Lindsey, NP    Family History Family History  Problem Relation Age of Onset  . Hypertension Mother   . Healthy Father     Social History Social History   Tobacco Use  . Smoking status: Current Every Day Smoker  . Smokeless tobacco: Never Used  Substance Use Topics  . Alcohol use: No    Alcohol/week: 0.0 oz  . Drug use: No     Allergies   Patient has no known allergies.   Review of Systems Review of Systems  Constitutional: Positive for fever. Negative for chills.  HENT: Positive for sore throat. Negative for tinnitus.   Eyes: Negative for redness.  Respiratory: Negative for cough and shortness of breath.   Cardiovascular: Negative for chest pain and  palpitations.  Gastrointestinal: Positive for nausea. Negative for abdominal pain, diarrhea and vomiting.  Genitourinary: Negative for dysuria, frequency and urgency.  Musculoskeletal: Positive for back pain. Negative for myalgias.  Skin: Negative for rash.       No lesions  Neurological: Positive for headaches. Negative for weakness.  Hematological: Does not bruise/bleed easily.  Psychiatric/Behavioral: Negative for suicidal ideas.     Physical Exam Triage Vital Signs ED Triage Vitals  Enc Vitals Group     BP 03/06/17 1952 107/71     Pulse Rate 03/06/17 1952 78     Resp 03/06/17 1952 16     Temp 03/06/17 1952 98.4 F (36.9 C)     Temp Source 03/06/17 1952 Oral     SpO2 --      Weight 03/06/17 1949 117 lb (53.1 kg)     Height --      Head Circumference --      Peak Flow --      Pain Score 03/06/17 1949 9     Pain Loc --      Pain Edu? --      Excl. in GC? --    No data found.  Updated Vital Signs BP 107/71 (BP Location: Left Arm)   Pulse 78   Temp 98.4 F (36.9 C) (  Oral)   Resp 16   Wt 117 lb (53.1 kg)   LMP 03/04/2017   BMI 20.08 kg/m   Visual Acuity Right Eye Distance:   Left Eye Distance:   Bilateral Distance:    Right Eye Near:   Left Eye Near:    Bilateral Near:     Physical Exam  Constitutional: She is oriented to person, place, and time. She appears well-developed and well-nourished. No distress.  HENT:  Head: Normocephalic and atraumatic.  Mouth/Throat: Oropharynx is clear and moist.  Eyes: Conjunctivae and EOM are normal. Pupils are equal, round, and reactive to light. No scleral icterus.  Neck: Normal range of motion. Neck supple. No JVD present. No tracheal deviation present. No thyromegaly present.  Cardiovascular: Normal rate, regular rhythm and normal heart sounds. Exam reveals no gallop and no friction rub.  No murmur heard. Pulmonary/Chest: Effort normal and breath sounds normal.  Abdominal: Soft. Bowel sounds are normal. She exhibits no  distension. There is no tenderness.  Musculoskeletal: Normal range of motion. She exhibits no edema.  Lymphadenopathy:    She has no cervical adenopathy.  Neurological: She is alert and oriented to person, place, and time. No cranial nerve deficit.  Positive Kernig and Brudinski's sign   Skin: Skin is warm and dry.  Psychiatric: She has a normal mood and affect. Her behavior is normal. Judgment and thought content normal.  Nursing note and vitals reviewed.    UC Treatments / Results  Labs (all labs ordered are listed, but only abnormal results are displayed) Labs Reviewed  URINALYSIS, COMPLETE (UACMP) WITH MICROSCOPIC - Abnormal; Notable for the following components:      Result Value   Color, Urine STRAW (*)    Leukocytes, UA MODERATE (*)    Squamous Epithelial / LPF 6-30 (*)    All other components within normal limits    EKG  EKG Interpretation None       Radiology No results found.  Procedures Procedures (including critical care time)  Medications Ordered in UC Medications - No data to display   Initial Impression / Assessment and Plan / UC Course  I have reviewed the triage vital signs and the nursing notes.  Pertinent labs & imaging results that were available during my care of the patient were reviewed by me and considered in my medical decision making (see chart for details).     Sx concerning for viral meningitis. Also trichomonas seen on UA. Advised patient to go to hospital for lumbar puncture, abx and viral prophylaxis, and treatment for trich (rx also written in clinic).   Final Clinical Impressions(s) / UC Diagnoses   Final diagnoses:  Meningitis, viral  Trichomoniasis    ED Discharge Orders    None       Controlled Substance Prescriptions [place] Controlled Substance Registry consulted? Not Applicable   Arnaldo Natal, MD 03/06/17 2030    Arnaldo Natal, MD 03/06/17 2031

## 2017-03-06 NOTE — ED Triage Notes (Signed)
Per patient c/o cough and urinary tract infection.

## 2017-03-07 ENCOUNTER — Telehealth: Payer: Self-pay

## 2017-03-07 NOTE — Telephone Encounter (Signed)
Pt calls in stating the pharmacy won't fill her prescription due to it being written wrong. I verified with Dr. Judd Gaudieronty and Renford DillsLindsey Miller regarding med and instructions. I called the pharmacy and changed the instr to pt to take one single dose of Flagyl 2000mg  (4 pills) NR. I called pt to let her know the proper instr and that I have called it into [facility]. She verbalized understanding.

## 2017-06-02 ENCOUNTER — Encounter: Payer: Self-pay | Admitting: Emergency Medicine

## 2017-06-02 ENCOUNTER — Other Ambulatory Visit: Payer: Self-pay

## 2017-06-02 ENCOUNTER — Ambulatory Visit
Admission: EM | Admit: 2017-06-02 | Discharge: 2017-06-02 | Disposition: A | Discharge status: Home / Self Care | Payer: BLUE CROSS/BLUE SHIELD | Attending: Family Medicine | Admitting: Family Medicine

## 2017-06-02 DIAGNOSIS — J029 Acute pharyngitis, unspecified: Secondary | ICD-10-CM

## 2017-06-02 DIAGNOSIS — H6501 Acute serous otitis media, right ear: Secondary | ICD-10-CM

## 2017-06-02 LAB — RAPID STREP SCREEN (MED CTR MEBANE ONLY): Streptococcus, Group A Screen (Direct): NEGATIVE

## 2017-06-02 MED ORDER — LIDOCAINE VISCOUS 2 % MT SOLN: OROMUCOSAL | 0 refills | Status: DC

## 2017-06-02 MED ORDER — AMOXICILLIN 875 MG PO TABS: 875.0000 mg | ORAL_TABLET | Freq: Two times a day (BID) | ORAL | 0 refills | Status: DC

## 2017-06-02 NOTE — ED Provider Notes (Signed)
[facility]    CSN: [id number] Arrival date & time: 06/02/17  1705     History   Chief Complaint Chief Complaint  Patient presents with  . Sore Throat  . Otalgia    HPI Breanna Lawrence is a 27 y.o. female.   The history is provided by the patient.  Sore Throat   Otalgia  Associated symptoms: congestion, rhinorrhea and sore throat   URI  Presenting symptoms: congestion, ear pain, rhinorrhea and sore throat   Severity:  Moderate Onset quality:  Sudden Duration:  4 days Timing:  Constant Progression:  Worsening Chronicity:  New Relieved by:  Nothing Ineffective treatments:  OTC medications Associated symptoms: myalgias   Risk factors: sick contacts   Risk factors: not elderly, no chronic cardiac disease, no chronic kidney disease, no chronic respiratory disease, no immunosuppression, no recent illness and no recent travel     Past Medical History:  Diagnosis Date  . No known health problems     There are no active problems to display for this patient.   History reviewed. No pertinent surgical history.  OB History   None      Home Medications    Prior to Admission medications   Medication Sig Start Date End Date Taking? Authorizing Provider  cetirizine (ZYRTEC) 10 MG tablet Take 10 mg by mouth. 05/29/17 05/29/18 Yes [provider]  cyclobenzaprine (FLEXERIL) 5 MG tablet Take 5 mg by mouth. 05/29/17  Yes [provider]  amoxicillin (AMOXIL) 875 MG tablet Take 1 tablet (875 mg total) by mouth 2 (two) times daily. 06/02/17   Payton Mccallumonty, Deauna Yaw, MD  benzonatate (TESSALON PERLES) 100 MG capsule Take 1 capsule (100 mg total) by mouth 3 (three) times daily as needed for cough. 02/18/17   Renford DillsMiller, Lindsey, NP  lidocaine (XYLOCAINE) 2 % solution 20 ml gargle and spit q 6 hours prn 06/02/17   Payton Mccallumonty, Brinkley Peet, MD  metroNIDAZOLE (FLAGYL) 500 MG tablet Take 4 tablets (2,000 mg total) by mouth 3 (three) times daily. 03/06/17   Arnaldo Nataliamond, Michael S, MD    ondansetron (ZOFRAN ODT) 4 MG disintegrating tablet Take 1 tablet (4 mg total) by mouth every 8 (eight) hours as needed for nausea or vomiting. 02/18/17   Renford DillsMiller, Lindsey, NP    Family History Family History  Problem Relation Age of Onset  . Hypertension Mother   . Healthy Father     Social History Social History   Tobacco Use  . Smoking status: Current Every Day Smoker  . Smokeless tobacco: Never Used  Substance Use Topics  . Alcohol use: No    Alcohol/week: 0.0 oz  . Drug use: No     Allergies   Patient has no known allergies.   Review of Systems Review of Systems  HENT: Positive for congestion, ear pain, rhinorrhea and sore throat.   Musculoskeletal: Positive for myalgias.     Physical Exam Triage Vital Signs ED Triage Vitals  Enc Vitals Group     BP 06/02/17 1716 (!) 119/92     Pulse Rate 06/02/17 1716 81     Resp 06/02/17 1716 16     Temp 06/02/17 1716 98.3 F (36.8 C)     Temp Source 06/02/17 1716 Oral     SpO2 06/02/17 1716 100 %     Weight 06/02/17 1717 119 lb (54 kg)     Height 06/02/17 1717 5\' 4"  (1.626 m)     Head Circumference --      Peak Flow --  Pain Score 06/02/17 1716 10     Pain Loc --      Pain Edu? --      Excl. in GC? --    No data found.  Updated Vital Signs BP (!) 119/92 (BP Location: Left Arm)   Pulse 81   Temp 98.3 F (36.8 C) (Oral)   Resp 16   Ht 5\' 4"  (1.626 m)   Wt 119 lb (54 kg)   LMP 05/13/2017 (Approximate)   SpO2 100%   BMI 20.43 kg/m   Visual Acuity Right Eye Distance:   Left Eye Distance:   Bilateral Distance:    Right Eye Near:   Left Eye Near:    Bilateral Near:     Physical Exam   UC Treatments / Results  Labs (all labs ordered are listed, but only abnormal results are displayed) Labs Reviewed  RAPID STREP SCREEN (NOT AT [facility])  CULTURE, GROUP A STREP [facility])    EKG None Radiology No results found.  Procedures Procedures (including critical care time)  Medications Ordered in  UC Medications - No data to display   Initial Impression / Assessment and Plan / UC Course  I have reviewed the triage vital signs and the nursing notes.  Pertinent labs & imaging results that were available during my care of the patient were reviewed by me and considered in my medical decision making (see chart for details).      Final Clinical Impressions(s) / UC Diagnoses   Final diagnoses:  Right acute serous otitis media, recurrence not specified    ED Discharge Orders        Ordered    lidocaine (XYLOCAINE) 2 % solution     06/02/17 1810    amoxicillin (AMOXIL) 875 MG tablet  2 times daily     06/02/17 1810     1. Lab result and diagnosis reviewed with patient 2. rx as per orders above; reviewed possible side effects, interactions, risks and benefits  3. Recommend supportive treatment with rest, fluids, otc analgesics prn 4. Follow-up prn if symptoms worsen or don't improve  Controlled Substance Prescriptions [place] Controlled Substance Registry consulted? Not Applicable   Payton Mccallum, MD 06/02/17 [phone]

## 2017-06-02 NOTE — ED Triage Notes (Signed)
Patient in today c/o sore throat and bilateral ear pain x 1 day. Patient also c/o back pain and was seen recently for this and was given Cyclobenzaprine 5 mg.

## 2017-06-05 LAB — CULTURE, GROUP A STREP (THRC)

## 2017-07-22 DIAGNOSIS — E559 Vitamin D deficiency, unspecified: Secondary | ICD-10-CM | POA: Insufficient documentation

## 2017-07-22 DIAGNOSIS — E782 Mixed hyperlipidemia: Secondary | ICD-10-CM | POA: Insufficient documentation

## 2017-07-22 DIAGNOSIS — Z77120 Contact with and (suspected) exposure to mold (toxic): Secondary | ICD-10-CM | POA: Insufficient documentation

## 2017-11-11 ENCOUNTER — Ambulatory Visit (INDEPENDENT_AMBULATORY_CARE_PROVIDER_SITE_OTHER): Payer: BLUE CROSS/BLUE SHIELD

## 2017-11-11 ENCOUNTER — Other Ambulatory Visit: Payer: Self-pay

## 2017-11-11 ENCOUNTER — Encounter: Payer: Self-pay | Admitting: Emergency Medicine

## 2017-11-11 ENCOUNTER — Ambulatory Visit
Admission: EM | Admit: 2017-11-11 | Discharge: 2017-11-11 | Disposition: A | Discharge status: Home / Self Care | Payer: BLUE CROSS/BLUE SHIELD | Attending: Family Medicine | Admitting: Family Medicine

## 2017-11-11 DIAGNOSIS — R1033 Periumbilical pain: Secondary | ICD-10-CM

## 2017-11-11 DIAGNOSIS — K5909 Other constipation: Secondary | ICD-10-CM

## 2017-11-11 DIAGNOSIS — K5900 Constipation, unspecified: Secondary | ICD-10-CM

## 2017-11-11 DIAGNOSIS — S8012XA Contusion of left lower leg, initial encounter: Secondary | ICD-10-CM | POA: Diagnosis not present

## 2017-11-11 LAB — CBC WITH DIFFERENTIAL/PLATELET
Basophils Absolute: 0 10*3/uL (ref 0–0.1)
Basophils Relative: 1 %
Eosinophils Absolute: 0.2 10*3/uL (ref 0–0.7)
Eosinophils Relative: 4 %
HCT: 34.7 % — ABNORMAL LOW (ref 35.0–47.0)
Hemoglobin: 11.6 g/dL — ABNORMAL LOW (ref 12.0–16.0)
Lymphocytes Relative: 33 %
Lymphs Abs: 1.9 10*3/uL (ref 1.0–3.6)
MCH: 29.9 pg (ref 26.0–34.0)
MCHC: 33.5 g/dL (ref 32.0–36.0)
MCV: 89.2 fL (ref 80.0–100.0)
Monocytes Absolute: 0.3 10*3/uL (ref 0.2–0.9)
Monocytes Relative: 6 %
Neutro Abs: 3.2 10*3/uL (ref 1.4–6.5)
Neutrophils Relative %: 56 %
Platelets: 209 10*3/uL (ref 150–440)
RBC: 3.9 MIL/uL (ref 3.80–5.20)
RDW: 13.7 % (ref 11.5–14.5)
WBC: 5.7 10*3/uL (ref 3.6–11.0)

## 2017-11-11 LAB — BASIC METABOLIC PANEL
Anion gap: 7 (ref 5–15)
BUN: 13 mg/dL (ref 6–20)
CO2: 26 mmol/L (ref 22–32)
Calcium: 8.9 mg/dL (ref 8.9–10.3)
Chloride: 106 mmol/L (ref 98–111)
Creatinine, Ser: 0.86 mg/dL (ref 0.44–1.00)
GFR calc Af Amer: >60 mL/min (ref >60)
GFR calc non Af Amer: >60 mL/min (ref >60)
Glucose, Bld: 89 mg/dL (ref 70–99)
Potassium: 3.8 mmol/L (ref 3.5–5.1)
Sodium: 139 mmol/L (ref 135–145)

## 2017-11-11 NOTE — ED Provider Notes (Signed)
[facility]    CSN: [id number] Arrival date & time: 11/11/17  1900     History   Chief Complaint Chief Complaint  Patient presents with  . Abdominal Pain  . Leg Pain    HPI Breanna Lawrence is a 27 y.o. female.   HPI  -year-old female presents with  abdominal pain indicates is periumbilical.  She is had no nausea or vomiting.  She has had   constipation type symptoms.  The pain is like" being kicked in the stomach."  Radiates into her back.  Started about a week ago.  Diet consists primarily of fast foods with very little fiber.  No fever or chills.  Is afebrile today.  Pulse 77 blood pressure 103/71 and O2 sats 98% on room air.  Last bowel movement states was just liquid and she is not passing gas for the last 2 to 3 days.  Problem is of a bruise over the anterior lateral shin.  This occurred while she was lying on a slip and slide.  He has not been using any ice or elevation.  To walk on it without difficulty.       Past Medical History:  Diagnosis Date  . No known health problems     There are no active problems to display for this patient.   Past Surgical History:  Procedure Laterality Date  . NO PAST SURGERIES      OB History   None      Home Medications    Prior to Admission medications   Not on File    Family History Family History  Problem Relation Age of Onset  . Hypertension Mother   . Healthy Father     Social History Social History   Tobacco Use  . Smoking status: Current Every Day Smoker    Packs/day: 0.50  . Smokeless tobacco: Never Used  Substance Use Topics  . Alcohol use: No    Alcohol/week: 0.0 standard drinks  . Drug use: No     Allergies   Patient has no known allergies.   Review of Systems Review of Systems  Constitutional: Positive for activity change and appetite change. Negative for chills, fatigue and fever.  Gastrointestinal: Positive for abdominal pain and diarrhea. Negative for abdominal  distention, anal bleeding, blood in stool, constipation, nausea and vomiting.     Physical Exam Triage Vital Signs ED Triage Vitals  Enc Vitals Group     BP 11/11/17 1924 103/71     Pulse Rate 11/11/17 1924 77     Resp 11/11/17 1924 17     Temp 11/11/17 1924 98.3 F (36.8 C)     Temp Source 11/11/17 1924 Oral     SpO2 11/11/17 1924 98 %     Weight 11/11/17 1923 121 lb (54.9 kg)     Height 11/11/17 1923 5\' 4"  (1.626 m)     Head Circumference --      Peak Flow --      Pain Score 11/11/17 1922 8     Pain Loc --      Pain Edu? --      Excl. in GC? --    No data found.  Updated Vital Signs BP 103/71 (BP Location: Left Arm)   Pulse 77   Temp 98.3 F (36.8 C) (Oral)   Resp 17   Ht 5\' 4"  (1.626 m)   Wt 121 lb (54.9 kg)   LMP 11/05/2017   SpO2 98%   BMI  20.77 kg/m   Visual Acuity Right Eye Distance:   Left Eye Distance:   Bilateral Distance:    Right Eye Near:   Left Eye Near:    Bilateral Near:     Physical Exam  Constitutional: She is oriented to person, place, and time. She appears well-developed and well-nourished.  Non-toxic appearance. She does not appear ill. No distress.  HENT:  Head: Normocephalic.  Eyes: Pupils are equal, round, and reactive to light.  Pulmonary/Chest: Effort normal and breath sounds normal.  Abdominal: Soft. Normal appearance and bowel sounds are normal. She exhibits no distension and no pulsatile midline mass. There is tenderness in the periumbilical area. There is no rigidity, no rebound, no guarding and no CVA tenderness.  Neurological: She is alert and oriented to person, place, and time.  Skin: Skin is warm and dry.  Emanation of the left lower extremity shows a ecchymotic area proximal shin or lateral.  Calf is soft.  Tenderness is over the bruise where there  is palpable hematoma present.  Psychiatric: She has a normal mood and affect. Her behavior is normal.  Nursing note and vitals reviewed.    UC Treatments / Results   Labs (all labs ordered are listed, but only abnormal results are displayed) Labs Reviewed  CBC WITH DIFFERENTIAL/PLATELET - Abnormal; Notable for the following components:      Result Value   Hemoglobin 11.6 (*)    HCT 34.7 (*)    All other components within normal limits  BASIC METABOLIC PANEL    EKG None  Radiology Dg Abd 2 Views  Result Date: 11/11/2017 CLINICAL DATA:  Mid abdominal pain EXAM: ABDOMEN - 2 VIEW COMPARISON:  None. FINDINGS: Nonobstructed bowel-gas pattern. Mild to moderate stool in the colon. Phleboliths in the left pelvis. No free air beneath the diaphragm. IMPRESSION: Nonobstructed bowel gas pattern with moderate stool. Electronically Signed   By: Jasmine Pang M.D.   On: 11/11/2017 20:27    Procedures Procedures (including critical care time)  Medications Ordered in UC Medications - No data to display  Initial Impression / Assessment and Plan / UC Course  I have reviewed the triage vital signs and the nursing notes.  Pertinent labs & imaging results that were available during my care of the patient were reviewed by me and considered in my medical decision making (see chart for details).      Discussed the findings with patient and her mother.  Laboratories are encouraging.  Mother states that she has Dulcolax tablets at home and can give her daughter some tonight.  I have told the daughter that she may also try MiraLAX although it does take longer.  I recommended that she increase the fiber in her diet.  She can do this with supplements such as Metamucil or Benefiber.  She should increase the fiber in her diet.  List of high-fiber foods were provided to the patient.  If she worsens or is not improving she should go to the emergency room. Final Clinical Impressions(s) / UC Diagnoses   Final diagnoses:  Other constipation     Discharge Instructions     Take Dulcolax tablets that your mom has tonight.  Need to increase your fluid intake and also fiber  intake.  Can supplemented with Metamucil or Benefiber .You should eat high-fiber foods instead.   If you Have worsening pain or not improve, suggest going to the emergency room.  For Your contusion on your leg ,use ice for 24 hours and then  switch over to heat.  Apply for every 2 hours 4-5 times per day.    ED Prescriptions    None     Controlled Substance Prescriptions [place] Controlled Substance Registry consulted? Not Applicable   Lutricia Feil, PA-C 11/11/17 2046

## 2017-11-11 NOTE — Discharge Instructions (Addendum)
Take Dulcolax tablets that your mom has tonight.  Need to increase your fluid intake and also fiber intake.  Can supplemented with Metamucil or Benefiber .You should eat high-fiber foods instead.   If you Have worsening pain or not improve, suggest going to the emergency room.  For Your contusion on your leg ,use ice for 24 hours and then switch over to heat.  Apply for every 2 hours 4-5 times per day.

## 2017-11-11 NOTE — ED Triage Notes (Signed)
Pt c/o general abdominal pain/soreness. She reports that it started about a week ago. It is sore to the touch and radiates to her back. stretching makes it better. She denies nausea, vomiting, diarrhea. She also reports that she was on a slip and slide and has a bruise on her left lower leg.

## 2018-02-17 ENCOUNTER — Ambulatory Visit
Admission: EM | Admit: 2018-02-17 | Discharge: 2018-02-17 | Disposition: A | Discharge status: Home / Self Care | Payer: Self-pay | Attending: Family Medicine | Admitting: Family Medicine

## 2018-02-17 ENCOUNTER — Other Ambulatory Visit: Payer: Self-pay

## 2018-02-17 DIAGNOSIS — X500XXA Overexertion from strenuous movement or load, initial encounter: Secondary | ICD-10-CM

## 2018-02-17 DIAGNOSIS — S39012A Strain of muscle, fascia and tendon of lower back, initial encounter: Secondary | ICD-10-CM | POA: Insufficient documentation

## 2018-02-17 MED ORDER — METAXALONE 800 MG PO TABS: 800.0000 mg | ORAL_TABLET | Freq: Three times a day (TID) | ORAL | 0 refills | Status: DC

## 2018-02-17 MED ORDER — MELOXICAM 15 MG PO TABS: 15.0000 mg | ORAL_TABLET | Freq: Every day | ORAL | 0 refills | Status: DC

## 2018-02-17 NOTE — ED Triage Notes (Signed)
Patient complains of low back pain that hurts on both sides. States that she was doing some lifting last night and thinks she hurt it.

## 2018-02-17 NOTE — ED Provider Notes (Signed)
[facility]    CSN: [id number] Arrival date & time: 02/17/18  1812     History   Chief Complaint Chief Complaint  Patient presents with  . Back Pain    low    HPI Breanna Lawrence is a 27 y.o. female.   HPI  Female presents with a low back pain that radiates into her left lateral hip on occasion.  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  She works at an [employer] and she states that after she noticed the onset of her back pain.  No bowel or bladder dysfunction.  She denies any distal radiation.  She does not have previous back injuries.      Past Medical History:  Diagnosis Date  . No known health problems     There are no active problems to display for this patient.   Past Surgical History:  Procedure Laterality Date  . NO PAST SURGERIES      OB History   No obstetric history on file.      Home Medications    Prior to Admission medications   Medication Sig Start Date End Date Taking? Authorizing Provider  meloxicam (MOBIC) 15 MG tablet Take 1 tablet (15 mg total) by mouth daily. 02/17/18   Lutricia Feiloemer, Glenda Spelman P, PA-C  metaxalone (SKELAXIN) 800 MG tablet Take 1 tablet (800 mg total) by mouth 3 (three) times daily. 02/17/18   Lutricia Feiloemer, Lakiesha Ralphs P, PA-C    Family History Family History  Problem Relation Age of Onset  . Hypertension Mother   . Healthy Father     Social History Social History   Tobacco Use  . Smoking status: Current Every Day Smoker    Packs/day: 0.50  . Smokeless tobacco: Never Used  Substance Use Topics  . Alcohol use: Yes    Alcohol/week: 0.0 standard drinks    Comment: socially  . Drug use: No     Allergies   Patient has no known allergies.   Review of Systems Review of Systems  Constitutional: Positive for activity change. Negative for appetite change, chills, fatigue and fever.  Musculoskeletal: Positive for back pain.  All other systems reviewed and are negative.    Physical  Exam Triage Vital Signs ED Triage Vitals  Enc Vitals Group     BP 02/17/18 1830 114/86     Pulse Rate 02/17/18 1830 71     Resp 02/17/18 1830 18     Temp 02/17/18 1830 98.8 F (37.1 C)     Temp Source 02/17/18 1830 Oral     SpO2 02/17/18 1830 100 %     Weight 02/17/18 1830 121 lb (54.9 kg)     Height 02/17/18 1830 5\' 4"  (1.626 m)     Head Circumference --      Peak Flow --      Pain Score 02/17/18 1828 7     Pain Loc --      Pain Edu? --      Excl. in GC? --    No data found.  Updated Vital Signs BP 114/86 (BP Location: Left Arm)   Pulse 71   Temp 98.8 F (37.1 C) (Oral)   Resp 18   Ht 5\' 4"  (1.626 m)   Wt 121 lb (54.9 kg)   LMP 02/03/2018   SpO2 100%   BMI 20.77 kg/m   Visual Acuity Right Eye Distance:   Left Eye Distance:   Bilateral Distance:    Right  Eye Near:   Left Eye Near:    Bilateral Near:     Physical Exam Vitals signs and nursing note reviewed. Exam conducted with a chaperone present.  Constitutional:      General: She is not in acute distress.    Appearance: Normal appearance. She is normal weight. She is not ill-appearing, toxic-appearing or diaphoretic.  HENT:     Head: Normocephalic.     Nose: Nose normal.     Mouth/Throat:     Mouth: Mucous membranes are moist.  Eyes:     Pupils: Pupils are equal, round, and reactive to light.  Neck:     Musculoskeletal: Normal range of motion.  Musculoskeletal:     Comments: The lumbar spine was performed with Efraim Kaufmann, CMA as chaperone and assistant.  The patient has a level pelvis in stance.  Is able to forward flex with her hands to the level of her knees before feeling discomfort.  Returning to upright posture was more difficult.  Flexion is also accompanied by pain but has more fluid motion.  Patient is able to toe and heel walk normally.  Is are 2+/4 the lower extremities.  Right leg raise testing in the seated position was negative at 90 degrees bilaterally.  hip range motion is intact.  Skin:     General: Skin is warm and dry.  Neurological:     General: No focal deficit present.     Mental Status: She is alert and oriented to person, place, and time.  Psychiatric:        Mood and Affect: Mood normal.        Behavior: Behavior normal.        Thought Content: Thought content normal.        Judgment: Judgment normal.      UC Treatments / Results  Labs (all labs ordered are listed, but only abnormal results are displayed) Labs Reviewed - No data to display  EKG None  Radiology No results found.  Procedures Procedures (including critical care time)  Medications Ordered in UC Medications - No data to display  Initial Impression / Assessment and Plan / UC Course  I have reviewed the triage vital signs and the nursing notes.  Pertinent labs & imaging results that were available during my care of the patient were reviewed by me and considered in my medical decision making (see chart for details).   Told patient that she has a lumbosacral strain.  She will need to void symptoms much as possible.  She should be restricted to lifting no greater than 10 pounds no pushing or pulling greater than 10 pounds for 7 days.  We will give her Mobic for anti-inflammatory effect and Skelaxin for muscle relaxation.  Precautions were given.  Not improving she may return to our clinic or follow-up with her primary care physician   Final Clinical Impressions(s) / UC Diagnoses   Final diagnoses:  Strain of lumbar region, initial encounter     Discharge Instructions     Avoid symptoms.Apply ice 20 minutes out of every 2 hours 4-5 times daily for comfort. Use  Caution while taking muscle relaxers.  Do not perform activities requiring concentration or judgment and do not drive.  Avoid prolonged sitting lifting or bending for 7 to 10 days.  Remain active with walking frequently    ED Prescriptions    Medication Sig Dispense Auth. Provider   meloxicam (MOBIC) 15 MG tablet Take 1 tablet  (15 mg total) by mouth  daily. 30 tablet Lutricia Feil, PA-C   metaxalone (SKELAXIN) 800 MG tablet Take 1 tablet (800 mg total) by mouth 3 (three) times daily. 21 tablet Lutricia Feil, PA-C     Controlled Substance Prescriptions [place] Controlled Substance Registry consulted? Not Applicable   Lutricia Feil, PA-C 02/17/18 1953

## 2018-02-17 NOTE — Discharge Instructions (Signed)
Avoid symptoms.Apply ice 20 minutes out of every 2 hours 4-5 times daily for comfort. Use  Caution while taking muscle relaxers.  Do not perform activities requiring concentration or judgment and do not drive.  Avoid prolonged sitting lifting or bending for 7 to 10 days.  Remain active with walking frequently

## 2018-02-21 ENCOUNTER — Encounter: Payer: Self-pay | Admitting: Medical Oncology

## 2018-02-21 ENCOUNTER — Emergency Department
Admission: EM | Admit: 2018-02-21 | Discharge: 2018-02-21 | Disposition: A | Discharge status: Home / Self Care | Payer: BLUE CROSS/BLUE SHIELD | Attending: Emergency Medicine | Admitting: Emergency Medicine

## 2018-02-21 DIAGNOSIS — F17210 Nicotine dependence, cigarettes, uncomplicated: Secondary | ICD-10-CM | POA: Insufficient documentation

## 2018-02-21 DIAGNOSIS — Z79899 Other long term (current) drug therapy: Secondary | ICD-10-CM | POA: Insufficient documentation

## 2018-02-21 DIAGNOSIS — J029 Acute pharyngitis, unspecified: Secondary | ICD-10-CM | POA: Insufficient documentation

## 2018-02-21 DIAGNOSIS — J069 Acute upper respiratory infection, unspecified: Secondary | ICD-10-CM | POA: Insufficient documentation

## 2018-02-21 DIAGNOSIS — H9209 Otalgia, unspecified ear: Secondary | ICD-10-CM | POA: Insufficient documentation

## 2018-02-21 LAB — GROUP A STREP BY PCR: Group A Strep by PCR: NOT DETECTED

## 2018-02-21 MED ORDER — BENZONATATE 100 MG PO CAPS: ORAL_CAPSULE | ORAL | 0 refills | Status: DC

## 2018-02-21 MED ORDER — DEXAMETHASONE SODIUM PHOSPHATE 10 MG/ML IJ SOLN
10.0000 mg | Freq: Once | INTRAMUSCULAR | Status: AC
  Administered 2018-02-21: 10 mg via INTRAMUSCULAR
  Filled 2018-02-21: qty 1

## 2018-02-21 MED ORDER — FLUTICASONE PROPIONATE 50 MCG/ACT NA SUSP: 2.0000 | Freq: Every day | NASAL | 0 refills | Status: DC

## 2018-02-21 NOTE — ED Triage Notes (Signed)
Sore throat and ear aches that began this am.

## 2018-02-21 NOTE — Discharge Instructions (Signed)
Your strep test was negative. Your symptoms are likely viral. Take the prescription meds as directed. You may also take OTC Delsym for cough relief. Follow-up with your provider for ongoing symptoms.

## 2018-02-21 NOTE — ED Provider Notes (Signed)
[facility] Emergency Department Provider Note ____________________________________________  Time seen: 1742  I have reviewed the triage vital signs and the nursing notes.  HISTORY  Chief Complaint  Sore Throat and Otalgia  HPI Breanna Lawrence is a 27 y.o. female who presents to the ED for evaluation of sudden onset of sore throat, cough, and ear pressure.  Describes onset of symptoms this morning after awakening.  She denies any fevers, chills, or sweats.  She has not taken any medication to alleviate her symptoms.  She denies any sick contacts, recent travel, or other exposures.  She does give report of a single episode of cough induced vomiting.  Did not receive the seasonal flu vaccine.  Past Medical History:  Diagnosis Date  . No known health problems     There are no active problems to display for this patient.   Past Surgical History:  Procedure Laterality Date  . NO PAST SURGERIES      Prior to Admission medications   Medication Sig Start Date End Date Taking? Authorizing Provider  meloxicam (MOBIC) 15 MG tablet Take 1 tablet (15 mg total) by mouth daily. 02/17/18   Lutricia Feiloemer, William P, PA-C  metaxalone (SKELAXIN) 800 MG tablet Take 1 tablet (800 mg total) by mouth 3 (three) times daily. 02/17/18   Lutricia Feiloemer, William P, PA-C    Allergies Patient has no known allergies.  Family History  Problem Relation Age of Onset  . Hypertension Mother   . Healthy Father     Social History Social History   Tobacco Use  . Smoking status: Current Every Day Smoker    Packs/day: 0.50  . Smokeless tobacco: Never Used  Substance Use Topics  . Alcohol use: Yes    Alcohol/week: 0.0 standard drinks    Comment: socially  . Drug use: No    Review of Systems  Constitutional: Negative for fever. Eyes: Negative for visual changes. ENT: Positive for sore throat.  Reports ear pressure as above. Cardiovascular: Negative for chest pain. Respiratory: Negative  for shortness of breath. Gastrointestinal: Negative for abdominal pain, vomiting and diarrhea. Genitourinary: Negative for dysuria. Musculoskeletal: Negative for back pain. Skin: Negative for rash. Neurological: Negative for headaches, focal weakness or numbness. ____________________________________________  PHYSICAL EXAM:  VITAL SIGNS: ED Triage Vitals  Enc Vitals Group     BP 02/21/18 1717 113/76     Pulse Rate 02/21/18 1717 85     Resp 02/21/18 1717 20     Temp 02/21/18 1717 98.1 F (36.7 C)     Temp Source 02/21/18 1717 Oral     SpO2 02/21/18 1717 98 %     Weight 02/21/18 1714 119 lb 0.8 oz (54 kg)     Height 02/21/18 1714 5\' 4"  (1.626 m)     Head Circumference --      Peak Flow --      Pain Score 02/21/18 1714 8     Pain Loc --      Pain Edu? --      Excl. in GC? --     Constitutional: Alert and oriented. Well appearing and in no distress. Head: Normocephalic and atraumatic. Eyes: Conjunctivae are normal. PERRL. Normal extraocular movements Ears: Canals clear. TMs intact bilaterally.  The left TM with a serous effusion noted. Nose: No congestion/rhinorrhea/epistaxis.  Right nostril with some turbinate erythema and thick mucus noted. Mouth/Throat: Mucous membranes are moist.  Uvula is midline and tonsils are flat.  There is oropharyngeal erythema noted. Neck: Supple. No  thyromegaly. Hematological/Lymphatic/Immunological: No cervical lymphadenopathy. Cardiovascular: Normal rate, regular rhythm. Normal distal pulses. Respiratory: Normal respiratory effort. No wheezes/rales/rhonchi. Gastrointestinal: Soft and nontender. No distention. Skin:  Skin is warm, dry and intact. No rash noted. ____________________________________________   LABS (pertinent positives/negatives)  Labs Reviewed  GROUP A STREP BY PCR  ____________________________________________  PROCEDURES  Procedures Decadron 10 mg IM ____________________________________________  INITIAL IMPRESSION /  ASSESSMENT AND PLAN / ED COURSE  Patient with ED evaluation of sudden sore throat and ear sinus pressure.  Patient symptoms likely represent a viral etiology.  Her strep PCR was negative at this time.  Patient will be discharged with a prescription for Tessalon Perles and Flonase to take as directed.  She is encouraged to continue to monitor and treat any other symptoms and take over-the-counter medications as necessary.  Return precautions have been reviewed. ____________________________________________  FINAL CLINICAL IMPRESSION(S) / ED DIAGNOSES  Final diagnoses:  Sore throat      Karmen StabsMenshew, Charlesetta IvoryJenise V Bacon, PA-C 02/21/18 1905    Emily FilbertWilliams, Jonathan E, MD 02/21/18 [phone]

## 2018-02-26 ENCOUNTER — Other Ambulatory Visit: Payer: Self-pay

## 2018-02-26 ENCOUNTER — Emergency Department
Admission: EM | Admit: 2018-02-26 | Discharge: 2018-02-26 | Disposition: A | Discharge status: Home / Self Care | Payer: Self-pay | Attending: Emergency Medicine | Admitting: Emergency Medicine

## 2018-02-26 DIAGNOSIS — H9203 Otalgia, bilateral: Secondary | ICD-10-CM | POA: Insufficient documentation

## 2018-02-26 DIAGNOSIS — J029 Acute pharyngitis, unspecified: Secondary | ICD-10-CM | POA: Insufficient documentation

## 2018-02-26 DIAGNOSIS — Z5321 Procedure and treatment not carried out due to patient leaving prior to being seen by health care provider: Secondary | ICD-10-CM | POA: Insufficient documentation

## 2018-02-26 NOTE — ED Notes (Signed)
No answer when called from lobby x2. 

## 2018-02-26 NOTE — ED Notes (Signed)
No answer when called from lobby x1 

## 2018-02-26 NOTE — ED Notes (Signed)
No answer when called from lobby x3. 

## 2018-02-26 NOTE — ED Triage Notes (Signed)
Pt arrives to ED via POV from home with c/o sore throat and bilateral otalgia x2 days. Pt reports being seen here for same and d/x'd with URI, states taking r/x'd medications without relief. Pt denies N/V/D, no fever.

## 2018-07-11 ENCOUNTER — Ambulatory Visit
Admission: EM | Admit: 2018-07-11 | Discharge: 2018-07-11 | Disposition: A | Discharge status: Home / Self Care | Payer: BLUE CROSS/BLUE SHIELD

## 2018-07-11 ENCOUNTER — Other Ambulatory Visit: Payer: Self-pay

## 2018-07-11 DIAGNOSIS — A084 Viral intestinal infection, unspecified: Secondary | ICD-10-CM

## 2018-07-11 DIAGNOSIS — R112 Nausea with vomiting, unspecified: Secondary | ICD-10-CM | POA: Diagnosis not present

## 2018-07-11 DIAGNOSIS — R197 Diarrhea, unspecified: Secondary | ICD-10-CM

## 2018-07-11 LAB — CBC WITH DIFFERENTIAL/PLATELET
Abs Immature Granulocytes: 0.02 10*3/uL (ref 0.00–0.07)
Basophils Absolute: 0 10*3/uL (ref 0.0–0.1)
Basophils Relative: 0 %
Eosinophils Absolute: 0 10*3/uL (ref 0.0–0.5)
Eosinophils Relative: 0 %
HCT: 39.5 % (ref 36.0–46.0)
Hemoglobin: 13.6 g/dL (ref 12.0–15.0)
Immature Granulocytes: 0 %
Lymphocytes Relative: 8 %
Lymphs Abs: 0.7 10*3/uL (ref 0.7–4.0)
MCH: 30.3 pg (ref 26.0–34.0)
MCHC: 34.4 g/dL (ref 30.0–36.0)
MCV: 88 fL (ref 80.0–100.0)
Monocytes Absolute: 0.2 10*3/uL (ref 0.1–1.0)
Monocytes Relative: 2 %
Neutro Abs: 6.9 10*3/uL (ref 1.7–7.7)
Neutrophils Relative %: 90 %
Platelets: 235 10*3/uL (ref 150–400)
RBC: 4.49 MIL/uL (ref 3.87–5.11)
RDW: 13.9 % (ref 11.5–15.5)
WBC: 7.8 10*3/uL (ref 4.0–10.5)
nRBC: 0 % (ref 0.0–0.2)

## 2018-07-11 LAB — COMPREHENSIVE METABOLIC PANEL
ALT: 17 U/L (ref 0–44)
AST: 24 U/L (ref 15–41)
Albumin: 4.7 g/dL (ref 3.5–5.0)
Alkaline Phosphatase: 68 U/L (ref 38–126)
Anion gap: 12 (ref 5–15)
BUN: 18 mg/dL (ref 6–20)
CO2: 21 mmol/L — ABNORMAL LOW (ref 22–32)
Calcium: 9.6 mg/dL (ref 8.9–10.3)
Chloride: 103 mmol/L (ref 98–111)
Creatinine, Ser: 0.73 mg/dL (ref 0.44–1.00)
GFR calc Af Amer: >60 mL/min (ref >60)
GFR calc non Af Amer: >60 mL/min (ref >60)
Glucose, Bld: 93 mg/dL (ref 70–99)
Potassium: 3.7 mmol/L (ref 3.5–5.1)
Sodium: 136 mmol/L (ref 135–145)
Total Bilirubin: 0.9 mg/dL (ref 0.3–1.2)
Total Protein: 8.3 g/dL — ABNORMAL HIGH (ref 6.5–8.1)

## 2018-07-11 MED ORDER — ONDANSETRON HCL 4 MG/2ML IJ SOLN
4.0000 mg | Freq: Once | INTRAMUSCULAR | Status: AC
  Administered 2018-07-11: 17:00:00 4 mg via INTRAVENOUS

## 2018-07-11 MED ORDER — ONDANSETRON 4 MG PO TBDP: 4.0000 mg | ORAL_TABLET | Freq: Three times a day (TID) | ORAL | 0 refills | Status: DC | PRN

## 2018-07-11 MED ORDER — ONDANSETRON 8 MG PO TBDP
8.0000 mg | ORAL_TABLET | Freq: Once | ORAL | Status: AC
  Administered 2018-07-11: 16:00:00 8 mg via ORAL

## 2018-07-11 MED ORDER — SODIUM CHLORIDE 0.9 % IV SOLN
Freq: Once | INTRAVENOUS | Status: AC
  Administered 2018-07-11: 17:00:00 via INTRAVENOUS

## 2018-07-11 NOTE — Discharge Instructions (Addendum)
It was very nice meeting you today in clinic. Thank you for entrusting me with your care.   As discussed, symptoms are most consistent with a viral or food borne illness. Your labs were normal. We will treat you symptomatically.   You were given both oral and IV nausea medications in clinic. We also gave your IV fluids.  I will give you some nausea medication to help with your nausea over the next few days. Please utilize the medications that we discussed. Your prescriptions have been called in to your pharmacy.  Increase fluid intake as much as possible. Water is always best, as sugar and caffeine containing fluids can cause you to become dehydrated. Try to incorporate electrolyte enriched fluids, such as Gatorade or Pedialyte, into your daily fluid intake.   Make arrangements to follow up with your regular doctor in the next few days if not feeling better. If your symptoms/condition worsens, please seek follow up care either here or in the ER. Please remember, our [facility] providers are "right here with you" when you need [place].   Again, it was my pleasure to take care of you today. Thank you for choosing our clinic. I hope that you start to feel better quickly.   Quentin Mulling, MSN, APRN, FNP-C, CEN Advanced Practice Provider [facility]

## 2018-07-11 NOTE — ED Triage Notes (Signed)
Patient complains of nausea and vomiting with diarrhea. Patient states that symptoms started yesterday and have been constant except diarrhea has stopped.

## 2018-07-11 NOTE — ED Provider Notes (Signed)
[address] [place], [place] [address] [phone]   Name: Breanna Lawrence DOB: September 23, 1990 MRN: [id number] CSN: [id number] PCP: Patient, No Pcp Per  Arrival date and time:  07/11/18 1504  Chief Complaint:  Emesis  NOTE: Prior to seeing the patient today, I have reviewed the triage nursing documentation and vital signs. Clinical staff has updated patient's PMH/PSHx, current medication list, and drug allergies/intolerances to ensure comprehensive history available to assist in medical decision making.   History:   HPI: Breanna Lawrence is a 28 y.o. female who presents today with complaints of nausea, vomiting, and diarrhea that began yesterday. Patient denies abdominal pain or urinary symptoms. Patient denies fevers. Diarrhea has slowed since onset without treatment. She notes that she is weak and fatigued.   Patient presents today pale and HYPOtensive with a blood pressure of 89/68. She notes that her symptoms started after eating a steak quesadilla yesterday. There were others with her that also developed diarrhea, however she has been the only one with nausea and vomiting.   Past Medical History:  Diagnosis Date  . No known health problems     Past Surgical History:  Procedure Laterality Date  . NO PAST SURGERIES      Family History  Problem Relation Age of Onset  . Hypertension Mother   . Healthy Father     Social History   Socioeconomic History  . Marital status: Single    Spouse name: Not on file  . Number of children: Not on file  . Years of education: Not on file  . Highest education level: Not on file  Occupational History  . Not on file  Social Needs  . Financial resource strain: Not on file  . Food insecurity:    Worry: Not on file    Inability: Not on file  . Transportation needs:    Medical: Not on file    Non-medical: Not on file  Tobacco Use  . Smoking status: Current Every Day Smoker    Packs/day: 0.50  . Smokeless tobacco:  Never Used  Substance and Sexual Activity  . Alcohol use: Yes    Alcohol/week: 0.0 standard drinks    Comment: socially  . Drug use: No  . Sexual activity: Not on file  Lifestyle  . Physical activity:    Days per week: Not on file    Minutes per session: Not on file  . Stress: Not on file  Relationships  . Social connections:    Talks on phone: Not on file    Gets together: Not on file    Attends religious service: Not on file    Active member of club or organization: Not on file    Attends meetings of clubs or organizations: Not on file    Relationship status: Not on file  . Intimate partner violence:    Fear of current or ex partner: Not on file    Emotionally abused: Not on file    Physically abused: Not on file    Forced sexual activity: Not on file  Other Topics Concern  . Not on file  Social History Narrative  . Not on file    There are no active problems to display for this patient.   Home Medications:    Current Meds  Medication Sig  . fluticasone (FLONASE) 50 MCG/ACT nasal spray Place 2 sprays into both nostrils daily.  . Vitamin D, Ergocalciferol, (DRISDOL) 1.25 MG (50000 UT) CAPS capsule Take by mouth.  Allergies:   Patient has no known allergies.  Review of Systems (ROS): Review of Systems  Constitutional: Positive for activity change, appetite change (not eating and drinking) and fatigue. Negative for chills and fever.  HENT: Negative.   Eyes: Negative.   Respiratory: Negative for cough and shortness of breath.   Cardiovascular: Negative for chest pain and palpitations.  Gastrointestinal: Positive for diarrhea, nausea and vomiting. Negative for abdominal pain.  Genitourinary: Negative for flank pain, frequency, hematuria and urgency.  Musculoskeletal: Positive for myalgias.  Neurological: Positive for dizziness and weakness (generalized).  Hematological: Negative for adenopathy.     Physical Exam:  Triage Vital Signs ED Triage Vitals  Enc  Vitals Group     BP 07/11/18 1539 (!) 89/68     Pulse Rate 07/11/18 1539 69     Resp 07/11/18 1539 16     Temp 07/11/18 1539 98 F (36.7 C)     Temp Source 07/11/18 1539 Oral     SpO2 07/11/18 1539 98 %     Weight 07/11/18 1538 124 lb (56.2 kg)     Height 07/11/18 1538  (1.626 m)     Head Circumference --      Peak Flow --      Pain Score 07/11/18 1536 0     Pain Loc --      Pain Edu? --      Excl. in GC? --     Physical Exam  Constitutional: She is oriented to person, place, and time and well-developed, well-nourished, and in no distress. She appears lethargic (listless).  HENT:  Head: Normocephalic and atraumatic.  Mouth/Throat: Oropharynx is clear and moist and mucous membranes are normal.  Eyes: Pupils are equal, round, and reactive to light. EOM are normal.  Neck: Normal range of motion. Neck supple. No tracheal deviation present.  Cardiovascular: Normal rate, regular rhythm, normal heart sounds and intact distal pulses. Exam reveals no gallop and no friction rub.  No murmur heard. Pulmonary/Chest: Effort normal and breath sounds normal. No respiratory distress. She has no wheezes. She has no rales.  Abdominal: Soft. Bowel sounds are normal. She exhibits no distension. There is no hepatosplenomegaly. There is generalized abdominal tenderness. There is no rebound, no guarding, no CVA tenderness, no tenderness at McBurney's point and negative Murphy's sign.  Lymphadenopathy:    She has no cervical adenopathy.  Neurological: She is oriented to person, place, and time. She appears lethargic (listless).  Skin: Skin is warm and dry. No rash noted. No erythema. There is pallor.  Psychiatric: Mood, affect and judgment normal.  Nursing note and vitals reviewed.    Urgent Care Treatments / Results:   LABS: PLEASE NOTE: all labs that were ordered this encounter are listed, however only abnormal results are displayed. Labs Reviewed  COMPREHENSIVE METABOLIC PANEL - Abnormal;  Notable for the following components:      Result Value   CO2 21 (*)    Total Protein 8.3 (*)    All other components within normal limits  CBC WITH DIFFERENTIAL/PLATELET    EKG: No orders found for this or any previous visit.  RADIOLOGY: No results found.  PRODEDURES: Procedures  MEDICATIONS RECEIVED THIS VISIT: Medications  ondansetron (ZOFRAN-ODT) disintegrating tablet 8 mg (8 mg Oral Given 07/11/18 1543)  0.9 %  sodium chloride infusion ( Intravenous Stopped 07/11/18 1714)  ondansetron (ZOFRAN) injection 4 mg (4 mg Intravenous Given 07/11/18 1642)    PERTINENT CLINICAL COURSE NOTES/UPDATES: No data to display Clinical Course  as of Jul 10 1816  Sat Jul 11, 2018  1625 Went in to reassess patient. She reports that she is feeling worse. Nausea and vertiginous symptoms have increased. Patient vomited PO fluids that were provided. Will place PIV and give IVFs and antiemetics.    [BG]  1700 Back in for patient reassessment. She is observed sleeping. Upon wakening patient, she notes that she feels "much better". Nausea has resolved. She is able to tolerate PO fluids. Discussed normal labs. Patient to be discharged home in stable condition at this time.    [BG]    Clinical Course User Index [BG] Verlee MonteGray, Ryah Cribb E, NP   Initial Impression / Assessment and Plan / Urgent Care Course:    Breanna Lawrence is a 28 y.o. female who [facility] with complaints of Emesis  Pertinent labs & imaging results that were available during my care of the patient were personally reviewed by me and considered in my medical decision making (see lab/imaging section of note for values and interpretations).  Presents today with acute N/V/D after eating takeout food last night. Others eating the same food developed similar symptoms. Patient pale and hypotensive in clinic. She was actively vomiting upon arrival. Patient improved initially following oral ondansetron, however vomited following  PO fluid challenge. Decision was made to treat patient with IVFs and antiemetics, which markedly improved patient's symptoms. Labs reviewed as normal. Blood pressure improved to 104/65 following fluids and patient able to tolerate POs. Suspected viral or food borne etiology to patient's symptoms. Given her improvement, patient safe for stable discharge home with oral antiemetics to use on a PRN basis. Patient encouraged to increased fluid intake and rest as much as possible.   Current clinical condition warrants patient being out of work in order to recover from her current injury/illness. She was provided with the appropriate documentation to provide to her place of employment that will allow for her to RTW on 07/13/2018 with no restrictions.   Discussed follow up with primary care physician this week for re-evaluation. I have reviewed the follow up and strict return precautions for any new or worsening symptoms. Patient is aware of symptoms that would be deemed urgent/emergent, and would thus require further evaluation either here or in the emergency department. At the time of discharge, she verbalized understanding and consent with the discharge plan as it was reviewed with her. All questions were fielded by provider and/or clinic staff prior to patient discharge.    Final Clinical Impressions(s) / Urgent Care Diagnoses:   Final diagnoses:  Viral gastroenteritis  Nausea vomiting and diarrhea    New Prescriptions:   Meds ordered this encounter  Medications  . ondansetron (ZOFRAN-ODT) disintegrating tablet 8 mg  . ondansetron (ZOFRAN-ODT) 4 MG disintegrating tablet    Sig: Take 1 tablet (4 mg total) by mouth every 8 (eight) hours as needed for nausea or vomiting.    Dispense:  15 tablet    Refill:  0  . 0.9 %  sodium chloride infusion  . ondansetron (ZOFRAN) injection 4 mg    Controlled Substance Prescriptions:  [place] Controlled Substance Registry consulted? Not Applicable  NOTE: This note  was prepared using Dragon dictation software along with smaller phrase technology. Despite my best ability to proofread, there is the potential that transcriptional errors may still occur from this process, and are completely unintentional.      Verlee MonteGray, Chane Cowden E, NP 07/11/18 Rickey [id number]

## 2018-09-01 ENCOUNTER — Telehealth: Payer: Self-pay | Admitting: Obstetrics & Gynecology

## 2018-09-01 NOTE — Telephone Encounter (Signed)
[facility] referring for Fluid in uterus in CT scan. Unable to leave voicemail due to voicemail not set up

## 2018-09-07 ENCOUNTER — Ambulatory Visit (INDEPENDENT_AMBULATORY_CARE_PROVIDER_SITE_OTHER): Payer: BLUE CROSS/BLUE SHIELD | Admitting: Obstetrics and Gynecology

## 2018-09-07 ENCOUNTER — Encounter: Payer: Self-pay | Admitting: Obstetrics and Gynecology

## 2018-09-07 ENCOUNTER — Other Ambulatory Visit: Payer: Self-pay

## 2018-09-07 VITALS — BP 90/50 | Ht 65.0 in | Wt 121.8 lb

## 2018-09-07 DIAGNOSIS — Z113 Encounter for screening for infections with a predominantly sexual mode of transmission: Secondary | ICD-10-CM | POA: Diagnosis not present

## 2018-09-07 NOTE — Progress Notes (Signed)
Patient ID: Breanna Lawrence, female   DOB: October 18, 1990, 28 y.o.   MRN: [id number]  Reason for Consult: Fluid in Uterus (referred by [facility])   Referred by Miki KinsShirley, Amanda M, FNP  Subjective:     HPI:  Breanna Lawrence is a 28 y.o. female . She presents today as a referral. She recently had a CT scan which showed fluid in the cul-de-sac. She has been having issues with nausea and mid sternal pain. She has been taking zofran and now has constipation with bowel movements every 2-3 days.  She reports menarchae at 14.  She has regular monthly periods which last for 5 days. 1-2 days of heavy bleeding then light. Painful cramps during her periods.  She is sexually active with women.  Her 2019 pap was NIL.   She had a small bump on her groin which itched 2-3 days ago.    Past Medical History:  Diagnosis Date  . No known health problems    Family History  Problem Relation Age of Onset  . Hypertension Mother   . Healthy Father    Past Surgical History:  Procedure Laterality Date  . NO PAST SURGERIES    . OTHER SURGICAL HISTORY     Hernia, 4-5 yrs old    Short Social History:  Social History   Tobacco Use  . Smoking status: Current Every Day Smoker    Packs/day: 0.50  . Smokeless tobacco: Never Used  Substance Use Topics  . Alcohol use: Yes    Alcohol/week: 0.0 standard drinks    Comment: socially    No Known Allergies  Current Outpatient Medications  Medication Sig Dispense Refill  . fluticasone (FLONASE) 50 MCG/ACT nasal spray Place 2 sprays into both nostrils daily. 16 g 0  . loratadine (CLARITIN) 10 MG tablet TK 1 T PO QAM    . ondansetron (ZOFRAN-ODT) 4 MG disintegrating tablet Take 1 tablet (4 mg total) by mouth every 8 (eight) hours as needed for nausea or vomiting. 15 tablet 0  . pantoprazole (PROTONIX) 40 MG tablet     . Vitamin D, Ergocalciferol, (DRISDOL) 1.25 MG (50000 UT) CAPS capsule Take by mouth.     No current facility-administered medications  for this visit.     Review of Systems  Constitutional: Negative for chills, fatigue, fever and unexpected weight change.  HENT: Negative for trouble swallowing.  Eyes: Negative for loss of vision.  Respiratory: Negative for cough, shortness of breath and wheezing.  Cardiovascular: Negative for chest pain, leg swelling, palpitations and syncope.  GI: Positive for abdominal pain and nausea. Negative for blood in stool, diarrhea and vomiting.       + constipation GU: Negative for difficulty urinating, dysuria, frequency and hematuria.  Musculoskeletal: Negative for back pain, leg pain and joint pain.  Skin: Negative for rash.  Neurological: Negative for dizziness, headaches, light-headedness, numbness and seizures.  Psychiatric: Negative for behavioral problem, confusion, depressed mood and sleep disturbance.        Objective:  Objective   Vitals:   09/07/18 1619  BP: (!) 90/50  Weight: 121 lb 12.8 oz (55.2 kg)  Height: 5\' 5"  (1.651 m)   Body mass index is 20.27 kg/m.  Physical Exam Vitals signs and nursing note reviewed. Exam conducted with a chaperone present.  Constitutional:      Appearance: She is well-developed.  HENT:     Head: Normocephalic and atraumatic.  Eyes:     Pupils: Pupils are equal, round, and reactive  to light.  Cardiovascular:     Rate and Rhythm: Normal rate and regular rhythm.  Pulmonary:     Effort: Pulmonary effort is normal. No respiratory distress.  Genitourinary:    General: Normal vulva.     Vagina: Normal.     Cervix: No cervical motion tenderness or discharge.     Uterus: Normal. Not deviated, not enlarged, not fixed and not tender.      Adnexa: Right adnexa normal and left adnexa normal.       Right: No mass or tenderness.         Left: No mass or tenderness.      Skin:    General: Skin is warm and dry.  Neurological:     Mental Status: She is alert and oriented to person, place, and time.  Psychiatric:        Behavior: Behavior  normal.        Thought Content: Thought content normal.        Judgment: Judgment normal.        Assessment/Plan:    28 yo here for consultation regarding CT scan results.  Fluid not in Uterus, fluid in cul-de-sac, this is likely physiologic, patient having no menstrual issues. No pelvic pain. Sexually active would like STD screening, will return for labs, labs closed at 4:30 pm.    More than 30 minutes were spent face to face with the patient in the room with more than 50% of the time spent providing counseling and discussing the plan of management.     Adrian Prows MD [facility] 09/07/2018 5:04 PM

## 2018-09-11 ENCOUNTER — Other Ambulatory Visit
Admission: RE | Admit: 2018-09-11 | Discharge: 2018-09-11 | Disposition: A | Discharge status: Home / Self Care | Payer: BLUE CROSS/BLUE SHIELD | Source: Ambulatory Visit | Attending: Internal Medicine | Admitting: Internal Medicine

## 2018-09-11 ENCOUNTER — Other Ambulatory Visit: Payer: BLUE CROSS/BLUE SHIELD

## 2018-09-11 ENCOUNTER — Other Ambulatory Visit: Payer: Self-pay

## 2018-09-11 DIAGNOSIS — Z1159 Encounter for screening for other viral diseases: Secondary | ICD-10-CM | POA: Insufficient documentation

## 2018-09-11 DIAGNOSIS — Z113 Encounter for screening for infections with a predominantly sexual mode of transmission: Secondary | ICD-10-CM

## 2018-09-11 DIAGNOSIS — Z01812 Encounter for preprocedural laboratory examination: Secondary | ICD-10-CM | POA: Diagnosis not present

## 2018-09-12 LAB — SARS CORONAVIRUS 2 (TAT 6-24 HRS): SARS Coronavirus 2: NEGATIVE

## 2018-09-15 LAB — HSV(HERPES SMPLX)ABS-I+II(IGG+IGM)-BLD
HSV 1 Glycoprotein G Ab, IgG: 27.3 index — ABNORMAL HIGH (ref 0.00–0.90)
HSV 2 IgG, Type Spec: <0.91 index (ref 0.00–0.90)
HSVI/II Comb IgM: <0.91 Ratio (ref 0.00–0.90)

## 2018-09-15 LAB — NUSWAB VAGINITIS PLUS (VG+)
BVAB 2: HIGH Score — AB
Candida albicans, NAA: NEGATIVE
Candida glabrata, NAA: NEGATIVE
Chlamydia trachomatis, NAA: NEGATIVE
Megasphaera 1: HIGH Score — AB
Neisseria gonorrhoeae, NAA: NEGATIVE
Trich vag by NAA: NEGATIVE

## 2018-09-15 LAB — RPR: RPR Ser Ql: NONREACTIVE

## 2018-09-15 LAB — HEPATITIS PANEL, ACUTE
Hep A IgM: NEGATIVE
Hep B C IgM: NEGATIVE
Hep C Virus Ab: <0.1 s/co ratio (ref 0.0–0.9)
Hepatitis B Surface Ag: NEGATIVE

## 2018-09-15 LAB — HIV ANTIBODY (ROUTINE TESTING W REFLEX): HIV Screen 4th Generation wRfx: NONREACTIVE

## 2018-09-16 ENCOUNTER — Ambulatory Visit: Payer: BLUE CROSS/BLUE SHIELD | Admitting: Certified Registered Nurse Anesthetist

## 2018-09-16 ENCOUNTER — Other Ambulatory Visit: Payer: Self-pay

## 2018-09-16 ENCOUNTER — Encounter: Payer: Self-pay | Admitting: *Deleted

## 2018-09-16 ENCOUNTER — Ambulatory Visit
Admission: RE | Admit: 2018-09-16 | Discharge: 2018-09-16 | Disposition: A | Discharge status: Home / Self Care | Payer: BLUE CROSS/BLUE SHIELD | Attending: Internal Medicine | Admitting: Internal Medicine

## 2018-09-16 ENCOUNTER — Encounter
Admission: RE | Disposition: A | Discharge status: Home / Self Care | Payer: Self-pay | Source: Home / Self Care | Attending: Internal Medicine

## 2018-09-16 DIAGNOSIS — Z79899 Other long term (current) drug therapy: Secondary | ICD-10-CM | POA: Diagnosis not present

## 2018-09-16 DIAGNOSIS — K219 Gastro-esophageal reflux disease without esophagitis: Secondary | ICD-10-CM | POA: Diagnosis not present

## 2018-09-16 DIAGNOSIS — R1314 Dysphagia, pharyngoesophageal phase: Secondary | ICD-10-CM | POA: Insufficient documentation

## 2018-09-16 HISTORY — PX: ESOPHAGOGASTRODUODENOSCOPY (EGD) WITH PROPOFOL: SHX5813

## 2018-09-16 LAB — POCT PREGNANCY, URINE: Preg Test, Ur: NEGATIVE

## 2018-09-16 SURGERY — ESOPHAGOGASTRODUODENOSCOPY (EGD) WITH PROPOFOL
Anesthesia: General

## 2018-09-16 MED ORDER — FENTANYL CITRATE (PF) 100 MCG/2ML IJ SOLN
INTRAMUSCULAR | Status: DC | PRN
  Administered 2018-09-16: 50 ug via INTRAVENOUS

## 2018-09-16 MED ORDER — PROPOFOL 500 MG/50ML IV EMUL
INTRAVENOUS | Status: DC | PRN
  Administered 2018-09-16: 140 ug/kg/min via INTRAVENOUS

## 2018-09-16 MED ORDER — SODIUM CHLORIDE 0.9 % IV SOLN
INTRAVENOUS | Status: DC
  Administered 2018-09-16: 08:00:00 via INTRAVENOUS

## 2018-09-16 MED ORDER — LIDOCAINE HCL (CARDIAC) PF 100 MG/5ML IV SOSY
PREFILLED_SYRINGE | INTRAVENOUS | Status: DC | PRN
  Administered 2018-09-16: 100 mg via INTRAVENOUS

## 2018-09-16 MED ORDER — GLYCOPYRROLATE 0.2 MG/ML IJ SOLN
INTRAMUSCULAR | Status: DC | PRN
  Administered 2018-09-16: 0.2 mg via INTRAVENOUS

## 2018-09-16 MED ORDER — PROPOFOL 10 MG/ML IV BOLUS
INTRAVENOUS | Status: DC | PRN
  Administered 2018-09-16: 50 mg via INTRAVENOUS

## 2018-09-16 MED ORDER — PROPOFOL 500 MG/50ML IV EMUL
INTRAVENOUS | Status: AC
  Filled 2018-09-16: qty 100

## 2018-09-16 MED ORDER — FENTANYL CITRATE (PF) 100 MCG/2ML IJ SOLN
INTRAMUSCULAR | Status: AC
  Filled 2018-09-16: qty 2

## 2018-09-16 NOTE — Anesthesia Procedure Notes (Signed)
Performed by: Ronica Vivian, CRNA Pre-anesthesia Checklist: Patient identified, Emergency Drugs available, Suction available, Patient being monitored and Timeout performed Patient Re-evaluated:Patient Re-evaluated prior to induction Oxygen Delivery Method: Nasal cannula Induction Type: IV induction       

## 2018-09-16 NOTE — Anesthesia Post-op Follow-up Note (Signed)
Anesthesia QCDR form completed.        

## 2018-09-16 NOTE — Transfer of Care (Signed)
Immediate Anesthesia Transfer of Care Note  Patient: Britaney Espaillat [place]  Procedure(s) Performed: ESOPHAGOGASTRODUODENOSCOPY (EGD) WITH PROPOFOL (N/A )  Patient Location: PACU  Anesthesia Type:General  Level of Consciousness: drowsy  Airway & Oxygen Therapy: Patient Spontanous Breathing  Post-op Assessment: Report given to RN and Post -op Vital signs reviewed and stable  Post vital signs: Reviewed and stable  Last Vitals:  Vitals Value Taken Time  BP 96/67 09/16/18 0912  Temp    Pulse 77 09/16/18 0911  Resp 10 09/16/18 0911  SpO2 97 % 09/16/18 0911  Vitals shown include unvalidated device data.  Last Pain:  Vitals:   09/16/18 0805  TempSrc: Tympanic  PainSc: 0-No pain         Complications: No apparent anesthesia complications

## 2018-09-16 NOTE — Op Note (Signed)
[facility] Patient Name: Breanna Lawrence Procedure Date: 09/16/2018 8:44 AM MRN: [id number] Account #: [id number] Date of Birth: May 24, 1990 Admit Type: Outpatient Age: 28 Room: [facility] ROOM 4 Gender: Female Note Status: Finalized Procedure:            Upper GI endoscopy Indications:          Epigastric abdominal pain, Dysphagia, Suspected                        esophageal reflux, Nausea Providers:            Boykin Nearingeodoro K. Lynton Crescenzo MD, MD Medicines:            Propofol per Anesthesia Complications:        No immediate complications. Procedure:            Pre-Anesthesia Assessment:                       - The risks and benefits of the procedure and the                        sedation options and risks were discussed with the                        patient. All questions were answered and informed                        consent was obtained.                       - Patient identification and proposed procedure were                        verified prior to the procedure by the nurse. The                        procedure was verified in the procedure room.                       - ASA Grade Assessment: II - A patient with mild                        systemic disease.                       - After reviewing the risks and benefits, the patient                        was deemed in satisfactory condition to undergo the                        procedure.                       After obtaining informed consent, the endoscope was                        passed under direct vision. Throughout the procedure,                        the patient's blood pressure, pulse, and oxygen  saturations were monitored continuously. The Endoscope                        was introduced through the mouth, and advanced to the                        third part of duodenum. The upper GI endoscopy was                        accomplished without difficulty. The  patient tolerated                        the procedure well. Findings:      No endoscopic abnormality was evident in the esophagus to explain the       patient's complaint of dysphagia. It was decided, however, to proceed       with dilation in the distal esophagus. The scope was withdrawn. Dilation       was performed with a Maloney dilator with no resistance at 54 Fr.      The entire examined stomach was normal.      The examined duodenum was normal.      The exam was otherwise without abnormality. Impression:           - No endoscopic esophageal abnormality to explain                        patient's dysphagia. Esophagus dilated. Dilated.                       - Normal stomach.                       - Normal examined duodenum.                       - The examination was otherwise normal.                       - No specimens collected. Recommendation:       - Patient has a contact number available for                        emergencies. The signs and symptoms of potential                        delayed complications were discussed with the patient.                        Return to normal activities tomorrow. Written discharge                        instructions were provided to the patient.                       - Resume previous diet.                       - Continue present medications.                       - Return to physician assistant in 6 weeks.                       -  You will be seen by Octavia Bruckner, PA-C for your                        follow up visit in the office. Procedure Code(s):    --- Professional ---                       [id number], Esophagogastroduodenoscopy, flexible, transoral;                        diagnostic, including collection of specimen(s) by                        brushing or washing, when performed (separate procedure)                       43450, Dilation of esophagus, by unguided sound or                        bougie, single or multiple  passes Diagnosis Code(s):    --- Professional ---                       R11.0, Nausea                       R10.13, Epigastric pain                       R13.10, Dysphagia, unspecified CPT copyright 2019 American Medical Association. All rights reserved. The codes documented in this report are preliminary and upon coder review may  be revised to meet current compliance requirements. Efrain Sella MD, MD 09/16/2018 9:14:25 AM This report has been signed electronically. Number of Addenda: 0 Note Initiated On: 09/16/2018 8:44 AM Estimated Blood Loss: Estimated blood loss: none.      Breanna Lawrence

## 2018-09-16 NOTE — H&P (Signed)
  Outpatient short stay form Pre-procedure 09/16/2018 8:48 AM Liyat Faulkenberry K. Alice Reichert, M.D.  Primary Physician: Clayborn Bigness, M.D.  Reason for visit:  Dysphagia, epigastric pain, GERD, nausea  History of present illness:  Patient is a 28 y/o female with chronic GERD with suboptimal response to PPI. Also has some esophageal dysphagia. Epigastric pain is present intermittently. No hemetemesis.    Current Facility-Administered Medications:  .  0.9 %  sodium chloride infusion, , Intravenous, Continuous, [place], Benay Pike, MD, Last Rate: 20 mL/hr at 09/16/18 9767  Medications Prior to Admission  Medication Sig Dispense Refill Last Dose  . fluticasone (FLONASE) 50 MCG/ACT nasal spray Place 2 sprays into both nostrils daily. 16 g 0 Past Week at Unknown time  . ondansetron (ZOFRAN-ODT) 4 MG disintegrating tablet Take 1 tablet (4 mg total) by mouth every 8 (eight) hours as needed for nausea or vomiting. 15 tablet 0 09/15/2018 at Unknown time  . pantoprazole (PROTONIX) 40 MG tablet    09/15/2018 at Unknown time  . Vitamin D, Ergocalciferol, (DRISDOL) 1.25 MG (50000 UT) CAPS capsule Take by mouth.   09/15/2018 at Unknown time  . loratadine (CLARITIN) 10 MG tablet TK 1 T PO QAM   Not Taking at Unknown time     No Known Allergies   Past Medical History:  Diagnosis Date  . No known health problems     Review of systems:  Otherwise negative.    Physical Exam  Gen: Alert, oriented. Appears stated age.  HEENT: [place]/AT. PERRLA. Lungs: CTA, no wheezes. CV: RR nl S1, S2. Abd: soft, benign, no masses. BS+ Ext: No edema. Pulses 2+    Planned procedures: Proceed with EGD. The patient understands the nature of the planned procedure, indications, risks, alternatives and potential complications including but not limited to bleeding, infection, perforation, damage to internal organs and possible oversedation/side effects from anesthesia. The patient agrees and gives consent to proceed.  Please refer to  procedure notes for findings, recommendations and patient disposition/instructions.     Torien Ramroop K. Alice Reichert, M.D. Gastroenterology 09/16/2018  8:48 AM

## 2018-09-17 ENCOUNTER — Encounter: Payer: Self-pay | Admitting: Internal Medicine

## 2018-09-17 NOTE — Anesthesia Preprocedure Evaluation (Signed)
Anesthesia Evaluation  Patient identified by MRN, date of birth, ID band Patient awake    Reviewed: Allergy & Precautions, H&P , NPO status , Patient's Chart, lab work & pertinent test results  Airway Mallampati: III  TM Distance: >3 FB Neck ROM: full    Dental  (+) Teeth Intact   Pulmonary neg COPD, Current Smoker,           Cardiovascular (-) angina(-) Past MI negative cardio ROS  (-) dysrhythmias      Neuro/Psych negative neurological ROS  negative psych ROS   GI/Hepatic Neg liver ROS, GERD  Controlled,  Endo/Other  negative endocrine ROS  Renal/GU negative Renal ROS  negative genitourinary   Musculoskeletal   Abdominal   Peds  Hematology negative hematology ROS (+)   Anesthesia Other Findings Past Medical History: No date: No known health problems  Past Surgical History: No date: NO PAST SURGERIES No date: OTHER SURGICAL HISTORY     Comment:  Hernia, 4-5 yrs old  BMI    Body Mass Index: 20.30 kg/m      Reproductive/Obstetrics negative OB ROS                             Anesthesia Physical Anesthesia Plan  ASA: II  Anesthesia Plan: General   Post-op Pain Management:    Induction:   PONV Risk Score and Plan: Propofol infusion and TIVA  Airway Management Planned: Natural Airway and Nasal Cannula  Additional Equipment:   Intra-op Plan:   Post-operative Plan:   Informed Consent: I have reviewed the patients History and Physical, chart, labs and discussed the procedure including the risks, benefits and alternatives for the proposed anesthesia with the patient or authorized representative who has indicated his/her understanding and acceptance.     Dental Advisory Given  Plan Discussed with: Anesthesiologist, CRNA and Surgeon  Anesthesia Plan Comments:         Anesthesia Quick Evaluation

## 2018-09-17 NOTE — Anesthesia Postprocedure Evaluation (Signed)
Anesthesia Post Note  Patient: Breanna Lawrence  Procedure(s) Performed: ESOPHAGOGASTRODUODENOSCOPY (EGD) WITH PROPOFOL (N/A )  Patient location during evaluation: PACU Anesthesia Type: General Level of consciousness: awake and alert Pain management: pain level controlled Vital Signs Assessment: post-procedure vital signs reviewed and stable Respiratory status: spontaneous breathing, nonlabored ventilation and respiratory function stable Cardiovascular status: blood pressure returned to baseline and stable Postop Assessment: no apparent nausea or vomiting Anesthetic complications: no     Last Vitals:  Vitals:   09/16/18 0930 09/16/18 0940  BP: 115/87 (!) 126/98  Pulse: 76 75  Resp: 10 12  Temp:    SpO2: 100% 100%    Last Pain:  Vitals:   09/17/18 0718  TempSrc:   PainSc: 0-No pain                 Durenda Hurt

## 2018-09-18 ENCOUNTER — Other Ambulatory Visit: Payer: Self-pay | Admitting: Obstetrics and Gynecology

## 2018-09-18 DIAGNOSIS — B9689 Other specified bacterial agents as the cause of diseases classified elsewhere: Secondary | ICD-10-CM

## 2018-09-18 MED ORDER — METRONIDAZOLE 500 MG PO TABS: 500.0000 mg | ORAL_TABLET | Freq: Two times a day (BID) | ORAL | 0 refills | Status: AC

## 2018-09-18 NOTE — Progress Notes (Signed)
Called and discussed with patient rx sent

## 2018-09-18 NOTE — Progress Notes (Signed)
Called and discussed with patient

## 2019-02-22 ENCOUNTER — Other Ambulatory Visit: Payer: Self-pay

## 2019-02-22 ENCOUNTER — Emergency Department
Admission: EM | Admit: 2019-02-22 | Discharge: 2019-02-22 | Disposition: A | Discharge status: Home / Self Care | Payer: BLUE CROSS/BLUE SHIELD | Attending: Emergency Medicine | Admitting: Emergency Medicine

## 2019-02-22 ENCOUNTER — Emergency Department: Payer: BLUE CROSS/BLUE SHIELD

## 2019-02-22 DIAGNOSIS — Z79899 Other long term (current) drug therapy: Secondary | ICD-10-CM | POA: Insufficient documentation

## 2019-02-22 DIAGNOSIS — F17210 Nicotine dependence, cigarettes, uncomplicated: Secondary | ICD-10-CM | POA: Insufficient documentation

## 2019-02-22 DIAGNOSIS — R0789 Other chest pain: Secondary | ICD-10-CM | POA: Insufficient documentation

## 2019-02-22 DIAGNOSIS — U071 COVID-19: Secondary | ICD-10-CM | POA: Diagnosis present

## 2019-02-22 LAB — BASIC METABOLIC PANEL
Anion gap: 10 (ref 5–15)
BUN: 10 mg/dL (ref 6–20)
CO2: 26 mmol/L (ref 22–32)
Calcium: 9.3 mg/dL (ref 8.9–10.3)
Chloride: 102 mmol/L (ref 98–111)
Creatinine, Ser: 0.88 mg/dL (ref 0.44–1.00)
GFR calc Af Amer: >60 mL/min (ref >60)
GFR calc non Af Amer: >60 mL/min (ref >60)
Glucose, Bld: 85 mg/dL (ref 70–99)
Potassium: 3.7 mmol/L (ref 3.5–5.1)
Sodium: 138 mmol/L (ref 135–145)

## 2019-02-22 LAB — CBC
HCT: 38 % (ref 36.0–46.0)
Hemoglobin: 12.9 g/dL (ref 12.0–15.0)
MCH: 29.3 pg (ref 26.0–34.0)
MCHC: 33.9 g/dL (ref 30.0–36.0)
MCV: 86.2 fL (ref 80.0–100.0)
Platelets: 211 10*3/uL (ref 150–400)
RBC: 4.41 MIL/uL (ref 3.87–5.11)
RDW: 13.5 % (ref 11.5–15.5)
WBC: 3.6 10*3/uL — ABNORMAL LOW (ref 4.0–10.5)
nRBC: 0 % (ref 0.0–0.2)

## 2019-02-22 LAB — TROPONIN I (HIGH SENSITIVITY): Troponin I (High Sensitivity): <2 ng/L (ref <18)

## 2019-02-22 MED ORDER — DEXTROMETHORPHAN HBR 15 MG/5ML PO SYRP: 10.0000 mL | ORAL_SOLUTION | Freq: Four times a day (QID) | ORAL | 0 refills | Status: DC | PRN

## 2019-02-22 MED ORDER — FAMOTIDINE 20 MG PO TABS: 20.0000 mg | ORAL_TABLET | Freq: Two times a day (BID) | ORAL | 0 refills | Status: DC

## 2019-02-22 NOTE — ED Provider Notes (Signed)
[facility] Emergency Department Provider Note ____________________________________________   First MD Initiated Contact with Patient 02/22/19 1104     (approximate)  I have reviewed the triage vital signs and the nursing notes.   HISTORY  Chief Complaint Chest Pain    HPI Hollie SalkShcarie D Sill is a 28 y.o. female with PMH as noted below who presents with chest pain, described as burning, present over the last week, and worse with coughing.  She was diagnosed with COVID-19 several days ago and has been having cough and diarrhea for about 1 week.  She states she had fever the first few days but this has resolved.  She reports some mild shortness of breath over the last 1 to 2 days but it is relieved with her albuterol inhaler.  She has a history of GERD but is not on any acid blocking medication.  Past Medical History:  Diagnosis Date  . No known health problems     There are no problems to display for this patient.   Past Surgical History:  Procedure Laterality Date  . ESOPHAGOGASTRODUODENOSCOPY (EGD) WITH PROPOFOL N/A 09/16/2018   Procedure: ESOPHAGOGASTRODUODENOSCOPY (EGD) WITH PROPOFOL;  Surgeon: Toledo, Boykin Nearingeodoro K, MD;  Location: [facility] ENDOSCOPY;  Service: Gastroenterology;  Laterality: N/A;  . NO PAST SURGERIES    . OTHER SURGICAL HISTORY     Hernia, 4-5 yrs old    Prior to Admission medications   Medication Sig Start Date End Date Taking? Authorizing Provider  dextromethorphan 15 MG/5ML syrup Take 10 mLs (30 mg total) by mouth 4 (four) times daily as needed for cough. 02/22/19   Dionne BucySiadecki, Adriana Quinby, MD  famotidine (PEPCID) 20 MG tablet Take 1 tablet (20 mg total) by mouth 2 (two) times daily for 15 days. 02/22/19 03/09/19  Dionne BucySiadecki, Brogen Duell, MD  fluticasone (FLONASE) 50 MCG/ACT nasal spray Place 2 sprays into both nostrils daily. 02/21/18   Menshew, Charlesetta IvoryJenise V Bacon, PA-C  loratadine (CLARITIN) 10 MG tablet TK 1 T PO QAM 05/26/18   [provider]  ondansetron (ZOFRAN-ODT) 4 MG disintegrating tablet Take 1 tablet (4 mg total) by mouth every 8 (eight) hours as needed for nausea or vomiting. 07/11/18   Verlee MonteGray, Bryan E, NP  pantoprazole (PROTONIX) 40 MG tablet  09/02/18   [provider]  Vitamin D, Ergocalciferol, (DRISDOL) 1.25 MG (50000 UT) CAPS capsule Take by mouth. 03/05/18   [provider]    Allergies Patient has no known allergies.  Family History  Problem Relation Age of Onset  . Hypertension Mother   . Healthy Father     Social History Social History   Tobacco Use  . Smoking status: Current Every Day Smoker    Packs/day: 0.50  . Smokeless tobacco: Never Used  Substance Use Topics  . Alcohol use: Yes    Alcohol/week: 0.0 standard drinks    Comment: socially  . Drug use: No    Review of Systems  Constitutional: No fever. Eyes: No redness. ENT: No sore throat. Cardiovascular: Positive for chest pain. Respiratory: Positive for shortness of breath. Gastrointestinal: No vomiting.  Positive for diarrhea.  Genitourinary: Negative for dysuria.  Musculoskeletal: Negative for back pain. Skin: Negative for rash. Neurological: Negative for headache.   ____________________________________________   PHYSICAL EXAM:  VITAL SIGNS: ED Triage Vitals  Enc Vitals Group     BP 02/22/19 1001 (!) 127/105     Pulse Rate 02/22/19 1001 71     Resp 02/22/19 1001 18     Temp  02/22/19 1001 98.6 F (37 C)     Temp Source 02/22/19 1001 Oral     SpO2 02/22/19 1001 98 %     Weight 02/22/19 1002 123 lb (55.8 kg)     Height 02/22/19 1002 5\' 4"  (1.626 m)     Head Circumference --      Peak Flow --      Pain Score 02/22/19 1002 7     Pain Loc --      Pain Edu? --      Excl. in [place]? --     Constitutional: Alert and oriented. Well appearing and in no acute distress. Eyes: Conjunctivae are normal.  Head: Atraumatic. Nose: No congestion/rhinnorhea. Mouth/Throat: Mucous membranes are moist.   Neck: Normal  range of motion.  Cardiovascular: Normal rate, regular rhythm.Good peripheral circulation. Respiratory: Normal respiratory effort.  No retractions.  Gastrointestinal:  No distention.  Musculoskeletal:  Extremities warm and well perfused.  Neurologic:  Normal speech and language. No gross focal neurologic deficits are appreciated.  Skin:  Skin is warm and dry. No rash noted. Psychiatric: Mood and affect are normal. Speech and behavior are normal.  ____________________________________________   LABS (all labs ordered are listed, but only abnormal results are displayed)  Labs Reviewed  CBC - Abnormal; Notable for the following components:      Result Value   WBC 3.6 (*)    All other components within normal limits  BASIC METABOLIC PANEL  POC URINE PREG, ED  TROPONIN I (HIGH SENSITIVITY)   ____________________________________________  EKG  ED ECG REPORT I, Arta Silence, the attending physician, personally viewed and interpreted this ECG.  Date: 02/22/2019 EKG Time: 1005 Rate: 74 Rhythm: normal sinus rhythm QRS Axis: normal Intervals: normal ST/T Wave abnormalities: Somewhat flat T waves diffusely Narrative Interpretation: no evidence of acute ischemia; no significant change when compared to EKG of 09/08/2009  ____________________________________________  RADIOLOGY  CXR: No focal infiltrate or other acute abnormality  ____________________________________________   PROCEDURES  Procedure(s) performed: No  Procedures  Critical Care performed: No ____________________________________________   INITIAL IMPRESSION / ASSESSMENT AND PLAN / ED COURSE  Pertinent labs & imaging results that were available during my care of the patient were reviewed by me and considered in my medical decision making (see chart for details).  28 year old female with PMH as noted above presents with atypical, nonexertional chest pain over the last week associated with coughing.  She has  been diagnosed with COVID-19.  She reports mild shortness of breath but gets relief from her inhaler.  She has diarrhea but no vomiting.  I reviewed the past medical records in [place].  The patient has a history of GERD and was worked up last summer for dysphagia with an endoscopy.  She states she is no longer on any H2 blocker or PPI.  On exam, she is very well-appearing.  Her vital signs are normal.  O2 saturation is 100% on room air.  She has no respiratory distress or increased work of breathing.  The physical exam is otherwise unremarkable.  EKG is nonischemic.  Lab work-up was obtained from triage and is within normal limits including high-sensitivity troponin.  Chest x-ray shows no infiltrate or other acute abnormality.  Overall presentation is consistent with musculoskeletal chest wall pain related to coughing, versus GERD.  There is no evidence of cardiac etiology and no indication for further work-up.  The patient has no PE risk factors and is PERC negative.  At this time, the patient is stable  for discharge home.  I will prescribe Pepcid and dextromethorphan.  Return precautions given, and the patient expresses understanding. __________________________________  Hollie Salk was evaluated in Emergency Department on 02/22/2019 for the symptoms described in the history of present illness. She was evaluated in the context of the global COVID-19 pandemic, which necessitated consideration that the patient might be at risk for infection with the SARS-CoV-2 virus that causes COVID-19. Institutional protocols and algorithms that pertain to the evaluation of patients at risk for COVID-19 are in a state of rapid change based on information released by regulatory bodies including the CDC and federal and state organizations. These policies and algorithms were followed during the patient's care in the ED.  ____________________________________________   FINAL CLINICAL IMPRESSION(S) / ED  DIAGNOSES  Final diagnoses:  Atypical chest pain  COVID-19      NEW MEDICATIONS STARTED DURING THIS VISIT:  New Prescriptions   DEXTROMETHORPHAN 15 MG/5ML SYRUP    Take 10 mLs (30 mg total) by mouth 4 (four) times daily as needed for cough.   FAMOTIDINE (PEPCID) 20 MG TABLET    Take 1 tablet (20 mg total) by mouth 2 (two) times daily for 15 days.     Note:  This document was prepared using Dragon voice recognition software and may include unintentional dictation errors.     Dionne Bucy, MD 02/22/19 1131

## 2019-02-22 NOTE — Discharge Instructions (Signed)
Return to the ER for new, worsening, or persistent severe chest pain, worsening shortness of breath, persistent high fevers, weakness, or any other new or worsening symptoms that concern you.

## 2019-02-22 NOTE — ED Triage Notes (Signed)
Burning type pain to center of chest X 1 week, starts with coughing and eases at rest. Mild burning pain to chest currently. Pt alert and oriented X4, cooperative, RR even and unlabored, color WNL. Pt in NAD. Pt COVID positive 12/16

## 2019-04-15 ENCOUNTER — Emergency Department
Admission: EM | Admit: 2019-04-15 | Discharge: 2019-04-15 | Disposition: A | Discharge status: Home / Self Care | Payer: 59 | Attending: Emergency Medicine | Admitting: Emergency Medicine

## 2019-04-15 ENCOUNTER — Other Ambulatory Visit: Payer: Self-pay

## 2019-04-15 DIAGNOSIS — M436 Torticollis: Secondary | ICD-10-CM | POA: Diagnosis not present

## 2019-04-15 DIAGNOSIS — Z79899 Other long term (current) drug therapy: Secondary | ICD-10-CM | POA: Diagnosis not present

## 2019-04-15 DIAGNOSIS — F17210 Nicotine dependence, cigarettes, uncomplicated: Secondary | ICD-10-CM | POA: Insufficient documentation

## 2019-04-15 DIAGNOSIS — M542 Cervicalgia: Secondary | ICD-10-CM | POA: Diagnosis present

## 2019-04-15 MED ORDER — LIDOCAINE 5 % EX PTCH
1.0000 | MEDICATED_PATCH | CUTANEOUS | Status: DC
  Administered 2019-04-15: 1 via TRANSDERMAL
  Filled 2019-04-15: qty 1

## 2019-04-15 MED ORDER — CYCLOBENZAPRINE HCL 10 MG PO TABS: 10.0000 mg | ORAL_TABLET | Freq: Three times a day (TID) | ORAL | 0 refills | Status: DC | PRN

## 2019-04-15 MED ORDER — CYCLOBENZAPRINE HCL 10 MG PO TABS
10.0000 mg | ORAL_TABLET | Freq: Once | ORAL | Status: AC
  Administered 2019-04-15: 10 mg via ORAL
  Filled 2019-04-15: qty 1

## 2019-04-15 NOTE — ED Provider Notes (Signed)
[facility] Emergency Department Provider Note  ____________________________________________   First MD Initiated Contact with Patient 04/15/19 0206     (approximate)  I have reviewed the triage vital signs and the nursing notes.   HISTORY  Chief Complaint Torticollis   HPI Breanna Lawrence is a 29 y.o. female with below list of previous medical conditions presents to the emergency department secondary to left trapezius/neck pain upon awakening.  Patient used Biofreeze before arrival without any improvement of pain.  Patient states that current pain score is 10 out of 10 worse with movement of the neck.  Patient denies any upper extremity weakness numbness.  Patient denies any headache no visual changes     Past Medical History:  Diagnosis Date  . No known health problems     There are no problems to display for this patient.   Past Surgical History:  Procedure Laterality Date  . ESOPHAGOGASTRODUODENOSCOPY (EGD) WITH PROPOFOL N/A 09/16/2018   Procedure: ESOPHAGOGASTRODUODENOSCOPY (EGD) WITH PROPOFOL;  Surgeon: Toledo, Boykin Nearing, MD;  Location: [facility] ENDOSCOPY;  Service: Gastroenterology;  Laterality: N/A;  . NO PAST SURGERIES    . OTHER SURGICAL HISTORY     Hernia, 4-5 yrs old    Prior to Admission medications   Medication Sig Start Date End Date Taking? Authorizing Provider  dextromethorphan 15 MG/5ML syrup Take 10 mLs (30 mg total) by mouth 4 (four) times daily as needed for cough. 02/22/19   Breanna Bucy, MD  famotidine (PEPCID) 20 MG tablet Take 1 tablet (20 mg total) by mouth 2 (two) times daily for 15 days. 02/22/19 03/09/19  Breanna Bucy, MD  fluticasone (FLONASE) 50 MCG/ACT nasal spray Place 2 sprays into both nostrils daily. 02/21/18   Breanna Lawrence  loratadine (CLARITIN) 10 MG tablet TK 1 T PO QAM 05/26/18   [provider]  ondansetron (ZOFRAN-ODT) 4 MG disintegrating tablet Take 1 tablet (4 mg total)  by mouth every 8 (eight) hours as needed for nausea or vomiting. 07/11/18   Breanna Lawrence  pantoprazole (PROTONIX) 40 MG tablet  09/02/18   [provider]  Vitamin D, Ergocalciferol, (DRISDOL) 1.25 MG (50000 UT) CAPS capsule Take by mouth. 03/05/18   [provider]    Allergies Patient has no known allergies.  Family History  Problem Relation Age of Onset  . Hypertension Mother   . Healthy Father     Social History Social History   Tobacco Use  . Smoking status: Current Every Day Smoker    Packs/day: 0.50  . Smokeless tobacco: Never Used  Substance Use Topics  . Alcohol use: Yes    Alcohol/week: 0.0 standard drinks    Comment: socially  . Drug use: No    Review of Systems Constitutional: No fever/chills Eyes: No visual changes. ENT: No sore throat. Cardiovascular: Denies chest pain. Respiratory: Denies shortness of breath. Gastrointestinal: No abdominal pain.  No nausea, no vomiting.  No diarrhea.  No constipation. Genitourinary: Negative for dysuria. Musculoskeletal: Negative for neck pain. Positive for back pain. Integumentary: Negative for rash. Neurological: Negative for headaches, focal weakness or numbness.  ____________________________________________   PHYSICAL EXAM:  VITAL SIGNS: ED Triage Vitals  Enc Vitals Group     BP 04/15/19 0151 125/84     Pulse Rate 04/15/19 0151 68     Resp 04/15/19 0151 20     Temp 04/15/19 0151 97.9 F (36.6 C)     Temp Source 04/15/19 0151 Oral  SpO2 04/15/19 0151 100 %     Weight 04/15/19 0152 56.7 kg (125 lb)     Height 04/15/19 0152 1.651 m (5\' 5" )     Head Circumference --      Peak Flow --      Pain Score 04/15/19 0152 10     Pain Loc --      Pain Edu? --      Excl. in [place]? --     Constitutional: Alert and oriented.  Eyes: Conjunctivae are normal.  Mouth/Throat: Patient is wearing a mask. Neck: No stridor. Pain to palpation left trapezius muscle in the left rhomboids Cardiovascular:  Normal rate, regular rhythm. Good peripheral circulation. Grossly normal heart sounds. Respiratory: Normal respiratory effort.  No retractions. Gastrointestinal: Soft and nontender. No distention.   Musculoskeletal: No lower extremity tenderness nor edema. No gross deformities of extremities. Neurologic:  Normal speech and language. No gross focal neurologic deficits are appreciated.  Skin:  Skin is warm, dry and intact. Psychiatric: Mood and affect are normal. Speech and behavior are normal.      Procedures   ____________________________________________   INITIAL IMPRESSION / MDM / ASSESSMENT AND PLAN / ED COURSE  As part of my medical decision making, I reviewed the following data within the electronic MEDICAL RECORD NUMBER   29 year old female presented with above-stated history and physical exam consistent with acute torticollis. Lidoderm patch applied patient given Flexeril and will be prescribed the same for home. Patient has no neurological deficits no headache.     ____________________________________________  FINAL CLINICAL IMPRESSION(S) / ED DIAGNOSES  Final diagnoses:  Torticollis, acute     MEDICATIONS GIVEN DURING THIS VISIT:  Medications  lidocaine (LIDODERM) 5 % 1 patch (1 patch Transdermal Patch Applied 04/15/19 0240)  cyclobenzaprine (FLEXERIL) tablet 10 mg (10 mg Oral Given 04/15/19 0240)     ED Discharge Orders    None      *Please note:  Breanna Lawrence was evaluated in Emergency Department on 04/15/2019 for the symptoms described in the history of present illness. She was evaluated in the context of the global COVID-19 pandemic, which necessitated consideration that the patient might be at risk for infection with the SARS-CoV-2 virus that causes COVID-19. Institutional protocols and algorithms that pertain to the evaluation of patients at risk for COVID-19 are in a state of rapid change based on information released by regulatory bodies including the CDC  and federal and state organizations. These policies and algorithms were followed during the patient's care in the ED.  Some ED evaluations and interventions may be delayed as a result of limited staffing during the pandemic.*  Note:  This document was prepared using Dragon voice recognition software and may include unintentional dictation errors.   Gregor Hams, MD 04/15/19 [phone]

## 2019-04-15 NOTE — ED Triage Notes (Signed)
Pt states she woke up tonight with neck stiffness and pain to left side. States worse when she moves her neck, radiates into left shoulder.

## 2019-06-30 ENCOUNTER — Other Ambulatory Visit: Payer: Self-pay

## 2019-06-30 ENCOUNTER — Ambulatory Visit
Admission: EM | Admit: 2019-06-30 | Discharge: 2019-06-30 | Disposition: A | Discharge status: Home / Self Care | Payer: 59 | Attending: Family Medicine | Admitting: Family Medicine

## 2019-06-30 DIAGNOSIS — L03116 Cellulitis of left lower limb: Secondary | ICD-10-CM | POA: Diagnosis not present

## 2019-06-30 MED ORDER — SULFAMETHOXAZOLE-TRIMETHOPRIM 800-160 MG PO TABS: 1.0000 | ORAL_TABLET | Freq: Two times a day (BID) | ORAL | 0 refills | Status: AC

## 2019-06-30 MED ORDER — MUPIROCIN 2 % EX OINT: TOPICAL_OINTMENT | CUTANEOUS | 0 refills | Status: DC

## 2019-06-30 NOTE — Discharge Instructions (Signed)
Take medication as prescribed. Keep clean. Avoid scratching.   Follow up with your primary care physician this week as needed. Return to Urgent care for new or worsening concerns.

## 2019-06-30 NOTE — ED Provider Notes (Signed)
[facility] ____________________________________________  Time seen: Approximately 12:20 PM  I have reviewed the triage vital signs and the nursing notes.   HISTORY  Chief Complaint Insect Bite   HPI Breanna Lawrence is a 29 y.o. female presenting for evaluation of left posterior calf skin changes.  Reports Saturday she noticed a area that was itching and she believes she had been bitten by insect.  States that she was scratching the area and she has since noticed some redness and changes around the insect bite as well as today had some soreness to that area.  Does still have some itching.  Denies fevers, other skin changes.  Denies drainage.  Reports tetanus immunization is up-to-date.  Denies injury.  Denies any other discomfort at the rest the leg or pain with flexing foot upwards or down.  Patient's last menstrual period was 06/22/2019.  Denies pregnancy.    Past Medical History:  Diagnosis Date  . No known health problems     There are no problems to display for this patient.   Past Surgical History:  Procedure Laterality Date  . ESOPHAGOGASTRODUODENOSCOPY (EGD) WITH PROPOFOL N/A 09/16/2018   Procedure: ESOPHAGOGASTRODUODENOSCOPY (EGD) WITH PROPOFOL;  Surgeon: Toledo, Boykin Nearing, MD;  Location: [facility] END[facility];  Service: Gastroenterology;  Laterality: N/A;  . NO PAST SURGERIES    . OTHER SURGICAL HISTORY     Hernia, 4-5 yrs old     No current facility-administered medications for this encounter.  Current Outpatient Medications:  .  loratadine (CLARITIN) 10 MG tablet, TK 1 T PO QAM, Disp: , Rfl:  .  cyclobenzaprine (FLEXERIL) 10 MG tablet, Take 1 tablet (10 mg total) by mouth 3 (three) times daily as needed., Disp: 30 tablet, Rfl: 0 .  dextromethorphan 15 MG/5ML syrup, Take 10 mLs (30 mg total) by mouth 4 (four) times daily as needed for cough., Disp: 120 mL, Rfl: 0 .  famotidine (PEPCID) 20 MG tablet, Take 1 tablet (20 mg total) by mouth 2 (two) times daily  for 15 days., Disp: 30 tablet, Rfl: 0 .  fluticasone (FLONASE) 50 MCG/ACT nasal spray, Place 2 sprays into both nostrils daily., Disp: 16 g, Rfl: 0 .  mupirocin ointment (BACTROBAN) 2 %, Apply two times a day for 7 days., Disp: 22 g, Rfl: 0 .  ondansetron (ZOFRAN-ODT) 4 MG disintegrating tablet, Take 1 tablet (4 mg total) by mouth every 8 (eight) hours as needed for nausea or vomiting., Disp: 15 tablet, Rfl: 0 .  pantoprazole (PROTONIX) 40 MG tablet, , Disp: , Rfl:  .  sulfamethoxazole-trimethoprim (BACTRIM DS) 800-160 MG tablet, Take 1 tablet by mouth 2 (two) times daily for 7 days., Disp: 14 tablet, Rfl: 0 .  Vitamin D, Ergocalciferol, (DRISDOL) 1.25 MG (50000 UT) CAPS capsule, Take by mouth., Disp: , Rfl:   Allergies Patient has no known allergies.  Family History  Problem Relation Age of Onset  . Hypertension Mother   . Healthy Father     Social History Social History   Tobacco Use  . Smoking status: Current Every Day Smoker    Packs/day: 0.50  . Smokeless tobacco: Never Used  Substance Use Topics  . Alcohol use: Yes    Alcohol/week: 0.0 standard drinks    Comment: socially  . Drug use: No    Review of Systems Constitutional: No fever Cardiovascular: Denies chest pain. Respiratory: Denies shortness of breath. Gastrointestinal: No abdominal pain.   Skin: Positive for rash. __________________________________________   PHYSICAL EXAM:  VITAL SIGNS: ED Triage  Vitals  Enc Vitals Group     BP 06/30/19 1149 117/82     Pulse Rate 06/30/19 1149 83     Resp 06/30/19 1149 16     Temp 06/30/19 1149 97.8 F (36.6 C)     Temp Source 06/30/19 1149 Oral     SpO2 06/30/19 1149 99 %     Weight 06/30/19 1147 123 lb (55.8 kg)     Height 06/30/19 1147 5\' 5"  (1.651 m)     Head Circumference --      Peak Flow --      Pain Score 06/30/19 1147 6     Pain Loc --      Pain Edu? --      Excl. in [place]? --     Constitutional: Alert and oriented. Well appearing and in no acute  distress. Eyes: Conjunctivae are normal. ENT      Head: Normocephalic and atraumatic. Cardiovascular: Good peripheral circulation. Respiratory: Normal respiratory effort without tachypnea nor retractions.  Musculoskeletal: Steady gait. Neurologic:  Normal speech and language. Speech is normal. No gait instability.  Skin:  Skin is warm, dry.  Except: Left posterior lateral calf area of 3 x 3 cm of erythema with centered small papule, mild induration, minimal tenderness, no fluctuance, no drainage, no other calf tenderness, no distal edema, distal pedal pulses intact, negative Homans' sign. Psychiatric: Mood and affect are normal. Speech and behavior are normal. Patient exhibits appropriate insight and judgment   ___________________________________________   LABS (all labs ordered are listed, but only abnormal results are displayed)  Labs Reviewed - No data to display ____________________________________________   PROCEDURES Procedures    INITIAL IMPRESSION / ASSESSMENT AND PLAN / ED COURSE  Pertinent labs & imaging results that were available during my care of the patient were reviewed by me and considered in my medical decision making (see chart for details).  Well-appearing patient.  Left calf cellulitis.  Will treat with oral Bactrim and topical Bactroban.  Discussed monitoring, keeping clean and supportive care.  Discussed indication, risks and benefits of medications with patient. Discussed follow up and return parameters including no resolution or any worsening concerns. Patient verbalized understanding and agreed to plan.   ____________________________________________   FINAL CLINICAL IMPRESSION(S) / ED DIAGNOSES  Final diagnoses:  Cellulitis of leg, left     ED Discharge Orders         Ordered    sulfamethoxazole-trimethoprim (BACTRIM DS) 800-160 MG tablet  2 times daily     06/30/19 1202    mupirocin ointment (BACTROBAN) 2 %     06/30/19 1202            Note: This dictation was prepared with Dragon dictation along with smaller phrase technology. Any transcriptional errors that result from this process are unintentional.         Marylene Land, NP 06/30/19 1224

## 2019-06-30 NOTE — ED Triage Notes (Signed)
Patient states that she has a bug bite on back of left calf since Saturday/Sunday. States that that her leg has been aching today.

## 2019-10-09 ENCOUNTER — Ambulatory Visit
Admission: EM | Admit: 2019-10-09 | Discharge: 2019-10-09 | Disposition: A | Discharge status: Home / Self Care | Payer: 59 | Attending: Emergency Medicine | Admitting: Emergency Medicine

## 2019-10-09 ENCOUNTER — Encounter: Payer: Self-pay | Admitting: Gynecology

## 2019-10-09 ENCOUNTER — Other Ambulatory Visit: Payer: Self-pay

## 2019-10-09 DIAGNOSIS — R519 Headache, unspecified: Secondary | ICD-10-CM | POA: Diagnosis not present

## 2019-10-09 DIAGNOSIS — R112 Nausea with vomiting, unspecified: Secondary | ICD-10-CM | POA: Diagnosis not present

## 2019-10-09 DIAGNOSIS — F17210 Nicotine dependence, cigarettes, uncomplicated: Secondary | ICD-10-CM | POA: Insufficient documentation

## 2019-10-09 DIAGNOSIS — Z79899 Other long term (current) drug therapy: Secondary | ICD-10-CM | POA: Insufficient documentation

## 2019-10-09 DIAGNOSIS — Z20822 Contact with and (suspected) exposure to covid-19: Secondary | ICD-10-CM | POA: Diagnosis not present

## 2019-10-09 DIAGNOSIS — R1084 Generalized abdominal pain: Secondary | ICD-10-CM | POA: Diagnosis not present

## 2019-10-09 HISTORY — DX: Anxiety disorder, unspecified: F41.9

## 2019-10-09 LAB — GLUCOSE, CAPILLARY: Glucose-Capillary: 73 mg/dL (ref 70–99)

## 2019-10-09 MED ORDER — ONDANSETRON 8 MG PO TBDP
8.0000 mg | ORAL_TABLET | Freq: Once | ORAL | Status: AC
  Administered 2019-10-09: 8 mg via ORAL

## 2019-10-09 MED ORDER — ONDANSETRON 8 MG PO TBDP
ORAL_TABLET | ORAL | 0 refills | Status: DC
Start: 2019-10-09 — End: 2021-12-01

## 2019-10-09 NOTE — ED Provider Notes (Signed)
HPI  SUBJECTIVE:  Breanna Lawrence is a 29 y.o. female who presents with nausea, 5-6 episodes of nonbilious, nonbloody emesis, diffuse abdominal pain described as soreness and headaches starting this morning.  Patient states that the abdominal pain seems to be getting better.  She is tolerating fluids.  She reports decreased urine output today.  She denies fevers, body aches, nasal congestion, sore throat, loss of sense of smell or taste, cough, shortness of breath.  No lightheadedness, dizziness.  No raw or undercooked foods, questionable leftovers, contacts with similar illness, recent travel, recent antibiotics.  No known Covid exposure.  She has received 1 dose of the Moderna vaccine.  She tried ginger ale without improvement in her symptoms.  Symptoms worse with movement.  She denies marijuana use, diabetes, hypertension, chronic kidney disease.  She had Covid in February/March 2021.  LMP: Last month.  Denies the possibility pregnant.  She is in a same-sex relationship.  Denies intercourse with males.  PMD: At [facility]     Past Medical History:  Diagnosis Date  . Anxiety   . No known health problems     Past Surgical History:  Procedure Laterality Date  . ESOPHAGOGASTRODUODENOSCOPY (EGD) WITH PROPOFOL N/A 09/16/2018   Procedure: ESOPHAGOGASTRODUODENOSCOPY (EGD) WITH PROPOFOL;  Surgeon: Toledo, Boykin Nearing, MD;  Location: [facility];  Service: Gastroenterology;  Laterality: N/A;  . NO PAST SURGERIES    . OTHER SURGICAL HISTORY     Hernia, 4-5 yrs old    Family History  Problem Relation Age of Onset  . Hypertension Mother   . Healthy Father     Social History   Tobacco Use  . Smoking status: Current Every Day Smoker    Packs/day: 0.50  . Smokeless tobacco: Never Used  Vaping Use  . Vaping Use: Never used  Substance Use Topics  . Alcohol use: Yes    Alcohol/week: 0.0 standard drinks    Comment: socially  . Drug use: No    No current facility-administered  medications for this encounter.  Current Outpatient Medications:  .  cyclobenzaprine (FLEXERIL) 10 MG tablet, Take 1 tablet (10 mg total) by mouth 3 (three) times daily as needed., Disp: 30 tablet, Rfl: 0 .  Vitamin D, Ergocalciferol, (DRISDOL) 1.25 MG (50000 UT) CAPS capsule, Take by mouth., Disp: , Rfl:  .  famotidine (PEPCID) 20 MG tablet, Take 1 tablet (20 mg total) by mouth 2 (two) times daily for 15 days., Disp: 30 tablet, Rfl: 0 .  fluticasone (FLONASE) 50 MCG/ACT nasal spray, Place 2 sprays into both nostrils daily., Disp: 16 g, Rfl: 0 .  loratadine (CLARITIN) 10 MG tablet, TK 1 T PO QAM, Disp: , Rfl:  .  ondansetron (ZOFRAN ODT) 8 MG disintegrating tablet, 1/2- 1 tablet q 8 hr prn nausea, vomiting, Disp: 20 tablet, Rfl: 0 .  pantoprazole (PROTONIX) 40 MG tablet, , Disp: , Rfl:   No Known Allergies   ROS  As noted in HPI.   Physical Exam  BP 103/75 (BP Location: Left Arm)   Pulse 72   Temp 98.2 F (36.8 C) (Oral)   Resp 16   Ht 5\' 4"  (1.626 m)   Wt 56.7 kg   LMP 10/09/2019   SpO2 98%   BMI 21.46 kg/m   Constitutional: Well developed, well nourished, no acute distress Eyes:  EOMI, conjunctiva normal bilaterally HENT: Normocephalic, atraumatic,mucus membranes moist Respiratory: Normal inspiratory effort, lungs clear bilaterally Cardiovascular: Normal rate regular rhythm no murmurs rubs or gallops cap refill  less than 2 seconds.   GI: nondistended soft, nontender.  Active bowel sounds.  No guarding, rebound.  No palpable masses. Back: No CVAT. skin: Good skin turgor Musculoskeletal: no deformities Neurologic: Alert & oriented x 3, no focal neuro deficits Psychiatric: Speech and behavior appropriate   ED Course   Medications  ondansetron (ZOFRAN-ODT) disintegrating tablet 8 mg (8 mg Oral Given 10/09/19 1636)    Orders Placed This Encounter  Procedures  . SARS CORONAVIRUS 2 (TAT 6-24 HRS) Nasopharyngeal Nasopharyngeal Swab    Standing Status:   Standing     Number of Occurrences:   1    Order Specific Question:   Is this test for diagnosis or screening    Answer:   Screening    Order Specific Question:   Symptomatic for COVID-19 as defined by CDC    Answer:   No    Order Specific Question:   Hospitalized for COVID-19    Answer:   No    Order Specific Question:   Admitted to ICU for COVID-19    Answer:   No    Order Specific Question:   Previously tested for COVID-19    Answer:   Yes    Order Specific Question:   Resident in a congregate (group) care setting    Answer:   No    Order Specific Question:   Employed in healthcare setting    Answer:   No    Order Specific Question:   Pregnant    Answer:   No    Order Specific Question:   Has patient completed COVID vaccination(s) (2 doses of Pfizer/Moderna 1 dose of [employer])    Answer:   No  . Glucose, capillary    Standing Status:   Standing    Number of Occurrences:   1  . CBG monitoring, ED    Standing Status:   Standing    Number of Occurrences:   1    No results found for this or any previous visit (from the past 24 hour(s)). No results found.  ED Clinical Impression  1. Non-intractable vomiting with nausea, unspecified vomiting type      ED Assessment/Plan  No BMP since 2018.  Will check as fingerstick since patient is reporting bilateral toe burning for the past several months.  Suspect viral illness.  Abdomen is benign.  No evidence of surgical abdomen.  Do not think that she appears to be so dehydrated that she needs IV fluids.  Her vitals are normal, cap refill normal, has good skin turgor.  Doubt pregnancy as she is in a same-sex relationship and denies having intercourse with a female.  She is otherwise healthy, has no comorbidities, labs deferred today.  No urinary complaints.  Tried to discontinue the Covid test, but it was sent out before I was able to cancel it.  Given 8 mg of Zofran ODT.  She was drinking ginger ale and tolerating p.o. prior to  discharge.  Glucose normal.  will send her home with Zofran 8 mg 3 times daily, push electrolyte containing fluids such as Pedialyte, may return here tomorrow if not getting better, to the ER if she gets worse.  Discussed labs,, MDM, treatment plan, and plan for follow-up with patient and parent. Discussed sn/sx that should prompt return to the ED. They agree with plan.   Meds ordered this encounter  Medications  . ondansetron (ZOFRAN-ODT) disintegrating tablet 8 mg  . ondansetron (ZOFRAN ODT) 8 MG disintegrating tablet  Sig: 1/2- 1 tablet q 8 hr prn nausea, vomiting    Dispense:  20 tablet    Refill:  0    *This clinic note was created using Scientist, clinical (histocompatibility and immunogenetics). Therefore, there may be occasional mistakes despite careful proofreading.   ?    Domenick Gong, MD 10/11/19 [phone]

## 2019-10-09 NOTE — Discharge Instructions (Addendum)
Zofran 3 times a day for nausea and vomiting.  It may make you constipated.  Push electrolyte containing fluids such as Pedialyte or Gatorade until your urine is clear.  It is okay to not eat for several days.  Start with bland foods such as crackers and broths.  Follow-up here with your doctor in a couple of days if not getting any better, go to the ER for fevers above 100.4, if your abdominal pain changes, gets worse, localizes to an area such as the right lower area of your abdomen, if you have not urinated in 24 hours, or for other concerns.

## 2019-10-09 NOTE — ED Triage Notes (Signed)
Per mom daughter with abdominal  Pain and vomiting x this am.

## 2019-10-10 LAB — SARS CORONAVIRUS 2 (TAT 6-24 HRS): SARS Coronavirus 2: NEGATIVE

## 2019-11-22 ENCOUNTER — Other Ambulatory Visit: Payer: Self-pay

## 2019-11-22 ENCOUNTER — Ambulatory Visit
Admission: EM | Admit: 2019-11-22 | Discharge: 2019-11-22 | Disposition: A | Discharge status: Home / Self Care | Payer: 59 | Attending: Family Medicine | Admitting: Family Medicine

## 2019-11-22 DIAGNOSIS — Z91038 Other insect allergy status: Secondary | ICD-10-CM | POA: Diagnosis not present

## 2019-11-22 MED ORDER — CETIRIZINE HCL 10 MG PO TABS
10.0000 mg | ORAL_TABLET | Freq: Every day | ORAL | 0 refills | Status: DC
Start: 2019-11-22 — End: 2022-03-25

## 2019-11-22 MED ORDER — TRIAMCINOLONE ACETONIDE 0.5 % EX OINT
1.0000 | TOPICAL_OINTMENT | Freq: Two times a day (BID) | CUTANEOUS | 0 refills | Status: DC
Start: 2019-11-22 — End: 2020-03-07

## 2019-11-22 NOTE — ED Triage Notes (Signed)
Patient has an insect bite to her left lower leg that occurred around 12-1pm while at work. States that area has continued to swell and has redness.

## 2019-11-22 NOTE — ED Provider Notes (Signed)
[facility]    CSN: [id number] Arrival date & time: 11/22/19  1859      History   Chief Complaint Chief Complaint  Patient presents with   Insect Bite   HPI  29 year old female presents with the above complaints.  Patient states that she believes she was bitten by an insect.  This occurred while at work.  Occurred around 12:48 PM today.  1 area is red and swollen.  The other area is red and itchy.  Mild to moderate pain.  No relieving factors.  No medications or interventions tried.  No other associated symptoms.  No other complaints.  Past Surgical History:  Procedure Laterality Date   ESOPHAGOGASTRODUODENOSCOPY (EGD) WITH PROPOFOL N/A 09/16/2018   Procedure: ESOPHAGOGASTRODUODENOSCOPY (EGD) WITH PROPOFOL;  Surgeon: Toledo, Boykin Nearing, MD;  Location: [facility];  Service: Gastroenterology;  Laterality: N/A;   NO PAST SURGERIES     OTHER SURGICAL HISTORY     Hernia, 4-5 yrs old    OB History    Gravida  0   Para  0   Term  0   Preterm  0   AB  0   Living  0     SAB  0   TAB  0   Ectopic  0   Multiple  0   Live Births  0            Home Medications    Prior to Admission medications   Medication Sig Start Date End Date Taking? Authorizing Provider  ondansetron (ZOFRAN ODT) 8 MG disintegrating tablet 1/2- 1 tablet q 8 hr prn nausea, vomiting 10/09/19  Yes Domenick Gong, MD  pantoprazole (PROTONIX) 40 MG tablet  09/02/18  Yes [provider]  Vitamin D, Ergocalciferol, (DRISDOL) 1.25 MG (50000 UT) CAPS capsule Take by mouth. 03/05/18  Yes [provider]  cetirizine (ZYRTEC ALLERGY) 10 MG tablet Take 1 tablet (10 mg total) by mouth daily. 11/22/19   Tommie Sams, DO  famotidine (PEPCID) 20 MG tablet Take 1 tablet (20 mg total) by mouth 2 (two) times daily for 15 days. 02/22/19 03/09/19  Dionne Bucy, MD  triamcinolone ointment (KENALOG) 0.5 % Apply 1 application topically 2 (two) times daily. 11/22/19   Tommie Sams, DO  fluticasone (FLONASE) 50 MCG/ACT nasal spray Place 2 sprays into both nostrils daily. 02/21/18 11/22/19  Menshew, Charlesetta Ivory, PA-C  loratadine (CLARITIN) 10 MG tablet TK 1 T PO QAM 05/26/18 11/22/19  [provider]    Family History Family History  Problem Relation Age of Onset   Hypertension Mother    Healthy Father     Social History Social History   Tobacco Use   Smoking status: Current Every Day Smoker    Packs/day: 0.50   Smokeless tobacco: Never Used  Vaping Use   Vaping Use: Never used  Substance Use Topics   Alcohol use: Yes    Alcohol/week: 0.0 standard drinks    Comment: socially   Drug use: No     Allergies   Patient has no known allergies.   Review of Systems Review of Systems  Skin:       Bug bites.   Physical Exam Triage Vital Signs ED Triage Vitals  Enc Vitals Group     BP 11/22/19 1923 103/75     Pulse Rate 11/22/19 1923 71     Resp 11/22/19 1923 16     Temp 11/22/19 1923 98 F (36.7 C)  Temp Source 11/22/19 1923 Oral     SpO2 11/22/19 1923 99 %     Weight 11/22/19 1922 123 lb (55.8 kg)     Height 11/22/19 1922 5\' 4"  (1.626 m)     Head Circumference --      Peak Flow --      Pain Score 11/22/19 1921 7     Pain Loc --      Pain Edu? --      Excl. in [place]? --    Updated Vital Signs BP 103/75 (BP Location: Right Arm)    Pulse 71    Temp 98 F (36.7 C) (Oral)    Resp 16    Ht 5\' 4"  (1.626 m)    Wt 55.8 kg    LMP 11/14/2019    SpO2 99%    BMI 21.11 kg/m   Visual Acuity Right Eye Distance:   Left Eye Distance:   Bilateral Distance:    Right Eye Near:   Left Eye Near:    Bilateral Near:     Physical Exam Vitals and nursing note reviewed.  Constitutional:      General: She is not in acute distress.    Appearance: Normal appearance. She is not ill-appearing.  HENT:     Head: Normocephalic and atraumatic.  Eyes:     General:        Right eye: No discharge.        Left eye: No discharge.      Conjunctiva/sclera: Conjunctivae normal.  Pulmonary:     Effort: Pulmonary effort is normal. No respiratory distress.  Skin:    Comments: Left lower extremity: Large area of swelling and erythema.  There is a more proximal area that is erythematous as well.  Neurological:     Mental Status: She is alert.  Psychiatric:        Mood and Affect: Mood normal.        Behavior: Behavior normal.    UC Treatments / Results  Labs (all labs ordered are listed, but only abnormal results are displayed) Labs Reviewed - No data to display  EKG   Radiology No results found.  Procedures Procedures (including critical care time)  Medications Ordered in UC Medications - No data to display  Initial Impression / Assessment and Plan / UC Course  I have reviewed the triage vital signs and the nursing notes.  Pertinent labs & imaging results that were available during my care of the patient were reviewed by me and considered in my medical decision making (see chart for details).    29 year old female presents with localized response/reaction to bug bites.  Treating with triamcinolone and Zyrtec.  Advised over-the-counter Benadryl.  Supportive care.  Final Clinical Impressions(s) / UC Diagnoses   Final diagnoses:  Allergic reaction to insect bite     Discharge Instructions     Medication as prescribed.  Benadryl tonight.  This should improve and resolved in the next few days.  If worsens, please let 01/14/2020 know.  Take care  Dr. 37    ED Prescriptions    Medication Sig Dispense Auth. Provider   cetirizine (ZYRTEC ALLERGY) 10 MG tablet Take 1 tablet (10 mg total) by mouth daily. 14 tablet Aviya Jarvie G, DO   triamcinolone ointment (KENALOG) 0.5 % Apply 1 application topically 2 (two) times daily. 30 g Adriana Simas, DO     PDMP not reviewed this encounter.   04-08-2003, Tommie Sams 11/22/19 2016

## 2019-11-22 NOTE — Discharge Instructions (Signed)
Medication as prescribed.  Benadryl tonight.  This should improve and resolved in the next few days.  If worsens, please let us know.  Take care  Dr. Adriana Simas

## 2020-03-07 ENCOUNTER — Ambulatory Visit
Admission: EM | Admit: 2020-03-07 | Discharge: 2020-03-07 | Disposition: A | Discharge status: Home / Self Care | Payer: HRSA Program | Attending: Family Medicine | Admitting: Family Medicine

## 2020-03-07 ENCOUNTER — Other Ambulatory Visit: Payer: Self-pay

## 2020-03-07 ENCOUNTER — Encounter: Payer: Self-pay | Admitting: Emergency Medicine

## 2020-03-07 DIAGNOSIS — B349 Viral infection, unspecified: Secondary | ICD-10-CM | POA: Diagnosis present

## 2020-03-07 DIAGNOSIS — Z20822 Contact with and (suspected) exposure to covid-19: Secondary | ICD-10-CM | POA: Diagnosis present

## 2020-03-07 DIAGNOSIS — U071 COVID-19: Secondary | ICD-10-CM | POA: Insufficient documentation

## 2020-03-07 MED ORDER — BENZONATATE 200 MG PO CAPS: 200.0000 mg | ORAL_CAPSULE | Freq: Three times a day (TID) | ORAL | 0 refills | Status: DC | PRN

## 2020-03-07 NOTE — ED Provider Notes (Signed)
[facility]    CSN: [id number] Arrival date & time: 03/07/20  1001      History   Chief Complaint Chief Complaint  Patient presents with  . Cough    [phone]  . Generalized Body Aches   HPI  30 year old female presents with the above complaints.  Patient states that her whole family is positive for COVID-19.  She has been symptomatic since Friday.  Reports chills, cough, body aches.  No documented fever.  No relieving factors.  She is most troubled by the cough.  Patient desires Covid testing today.  No other complaints or concerns at this time.  Home Medications    Prior to Admission medications   Medication Sig Start Date End Date Taking? Authorizing Provider  benzonatate (TESSALON) 200 MG capsule Take 1 capsule (200 mg total) by mouth 3 (three) times daily as needed for cough. 03/07/20  Yes Carleena Mires G, DO  cetirizine (ZYRTEC ALLERGY) 10 MG tablet Take 1 tablet (10 mg total) by mouth daily. 11/22/19   Tommie Sams, DO  ondansetron (ZOFRAN ODT) 8 MG disintegrating tablet 1/2- 1 tablet q 8 hr prn nausea, vomiting 10/09/19   Domenick Gong, MD  pantoprazole (PROTONIX) 40 MG tablet  09/02/18   [provider]  Vitamin D, Ergocalciferol, (DRISDOL) 1.25 MG (50000 UT) CAPS capsule Take by mouth. 03/05/18   [provider]  famotidine (PEPCID) 20 MG tablet Take 1 tablet (20 mg total) by mouth 2 (two) times daily for 15 days. 02/22/19 03/07/20  Dionne Bucy, MD  fluticasone (FLONASE) 50 MCG/ACT nasal spray Place 2 sprays into both nostrils daily. 02/21/18 11/22/19  Menshew, Charlesetta Ivory, PA-C  loratadine (CLARITIN) 10 MG tablet TK 1 T PO QAM 05/26/18 11/22/19  [provider]    Family History Family History  Problem Relation Age of Onset  . Hypertension Mother   . Healthy Father     Social History Social History   Tobacco Use  . Smoking status: Current Every Day Smoker    Packs/day: 0.50  . Smokeless tobacco: Never Used  Vaping  Use  . Vaping Use: Never used  Substance Use Topics  . Alcohol use: Yes    Alcohol/week: 0.0 standard drinks    Comment: socially  . Drug use: No     Allergies   Patient has no known allergies.   Review of Systems Review of Systems  Constitutional: Positive for chills.  Respiratory: Positive for cough.   Musculoskeletal:       Body aches.   Physical Exam Triage Vital Signs ED Triage Vitals  Enc Vitals Group     BP 03/07/20 1216 (!) 125/92     Pulse Rate 03/07/20 1216 (!) 57     Resp 03/07/20 1216 18     Temp 03/07/20 1216 98.6 F (37 C)     Temp Source 03/07/20 1216 Oral     SpO2 03/07/20 1216 100 %     Weight 03/07/20 1049 123 lb 0.3 oz (55.8 kg)     Height 03/07/20 1049 5\' 4"  (1.626 m)     Head Circumference --      Peak Flow --      Pain Score 03/07/20 1049 8     Pain Loc --      Pain Edu? --      Excl. in GC? --    Updated Vital Signs BP (!) 125/92 (BP Location: Left Arm)   Pulse (!) 57   Temp 98.6 F (  37 C) (Oral)   Resp 18   Ht 5\' 4"  (1.626 m)   Wt 55.8 kg   LMP 02/13/2020   SpO2 100%   BMI 21.12 kg/m   Visual Acuity Right Eye Distance:   Left Eye Distance:   Bilateral Distance:    Right Eye Near:   Left Eye Near:    Bilateral Near:     Physical Exam Vitals and nursing note reviewed.  Constitutional:      General: She is not in acute distress.    Appearance: Normal appearance. She is not ill-appearing.  HENT:     Head: Normocephalic and atraumatic.  Eyes:     General:        Right eye: No discharge.        Left eye: No discharge.     Conjunctiva/sclera: Conjunctivae normal.  Cardiovascular:     Rate and Rhythm: Regular rhythm. Bradycardia present.  Pulmonary:     Effort: Pulmonary effort is normal.     Breath sounds: Normal breath sounds. No wheezing, rhonchi or rales.  Neurological:     Mental Status: She is alert.  Psychiatric:        Mood and Affect: Mood normal.        Behavior: Behavior normal.    UC Treatments /  Results  Labs (all labs ordered are listed, but only abnormal results are displayed) Labs Reviewed  SARS CORONAVIRUS 2 (TAT 6-24 HRS)    EKG   Radiology No results found.  Procedures Procedures (including critical care time)  Medications Ordered in UC Medications - No data to display  Initial Impression / Assessment and Plan / UC Course  I have reviewed the triage vital signs and the nursing notes.  Pertinent labs & imaging results that were available during my care of the patient were reviewed by me and considered in my medical decision making (see chart for details).    30 year old female presents with viral illness.  Suspected COVID-19.  Awaiting test result.  Tessalon Perles for cough.  Final Clinical Impressions(s) / UC Diagnoses   Final diagnoses:  Viral illness  Suspected COVID-19 virus infection     Discharge Instructions     Medication as prescribed.  Stay home.  Check my chart for COVID test results.  Take care  Dr. 26     ED Prescriptions    Medication Sig Dispense Auth. Provider   benzonatate (TESSALON) 200 MG capsule Take 1 capsule (200 mg total) by mouth 3 (three) times daily as needed for cough. 30 capsule Adriana Simas, DO     PDMP not reviewed this encounter.   Tommie Sams, Tommie Sams 03/07/20 1244

## 2020-03-07 NOTE — ED Triage Notes (Signed)
Pt c/o cough, body aches, chills, sweats. Started about 3 days ago. Denies fever.

## 2020-03-07 NOTE — Discharge Instructions (Signed)
Medication as prescribed.  Stay home.  Check my chart for COVID test results.  Take care  Dr. Jevin Camino   

## 2020-03-08 LAB — SARS CORONAVIRUS 2 (TAT 6-24 HRS): SARS Coronavirus 2: POSITIVE — AB

## 2020-06-08 IMAGING — CR DG ABDOMEN 2V
3 series · 3 of 3 positions shown · non-contrast
Comparison: None.

CLINICAL DATA: Mid abdominal pain

EXAM:
ABDOMEN - 2 VIEW

[abdomen erect (1 of 2)]
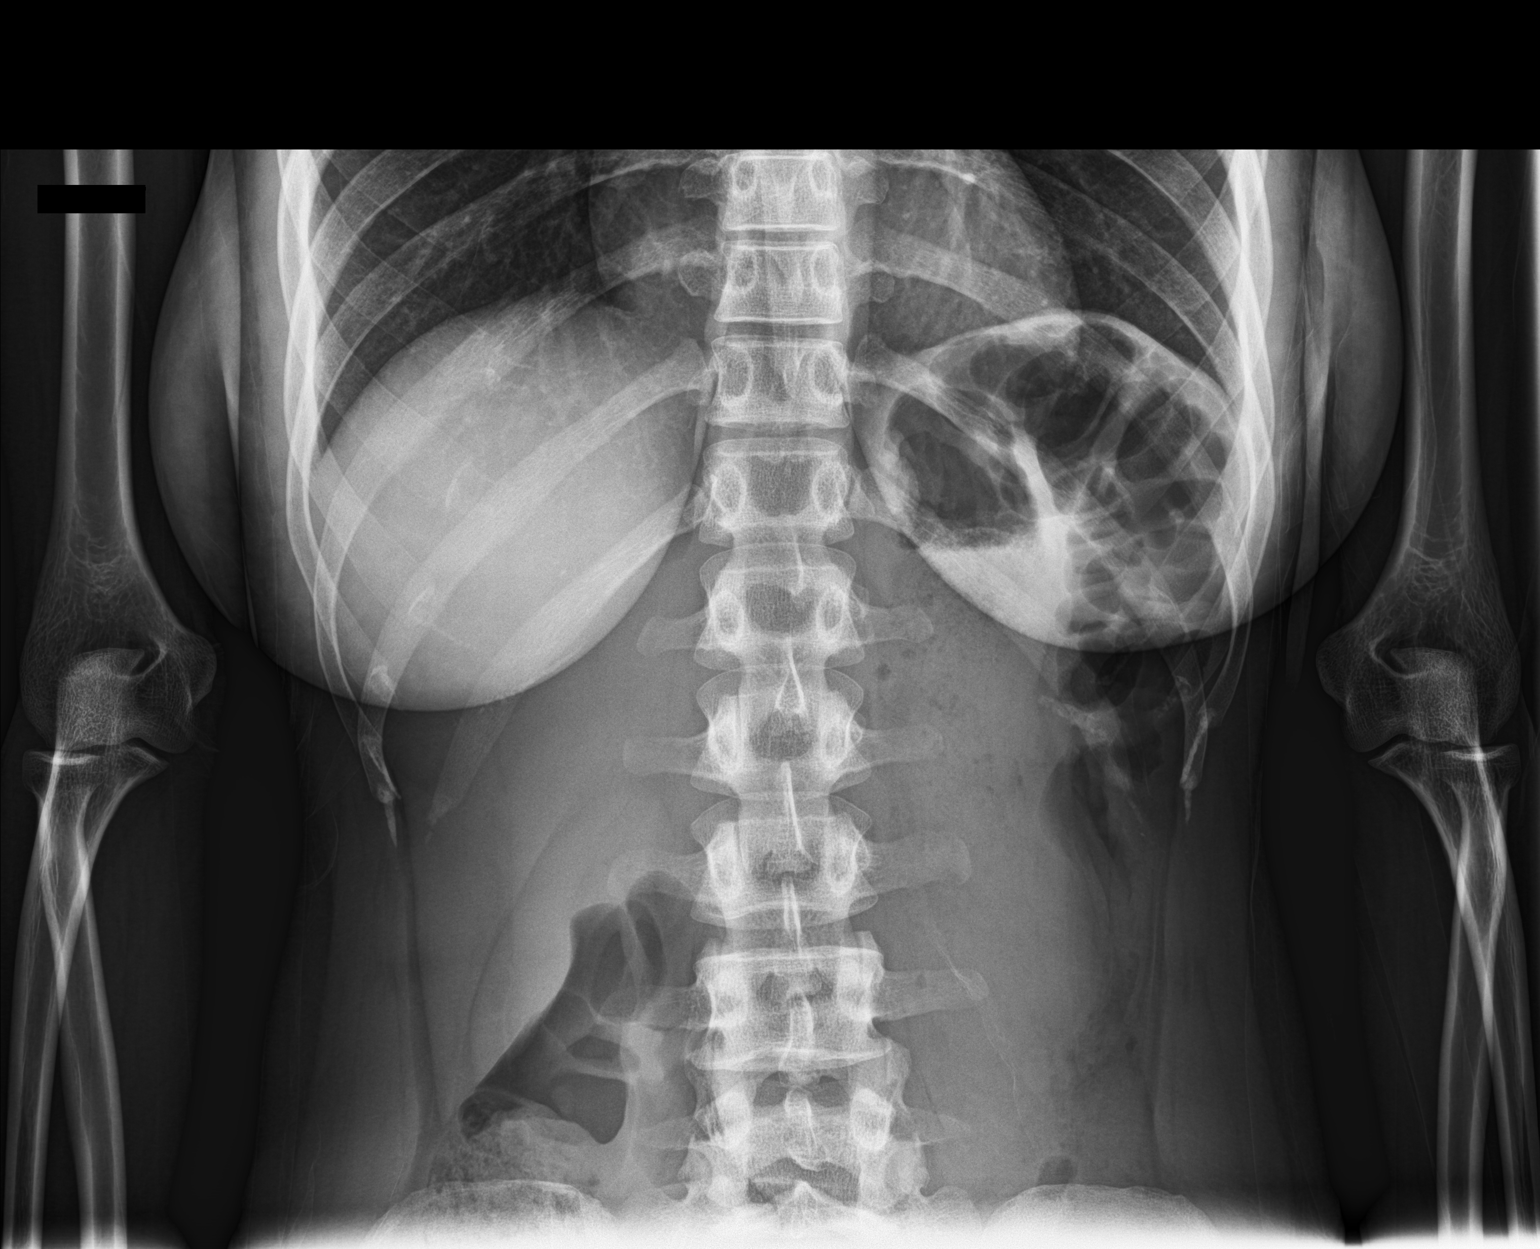

[abdomen erect (2 of 2)]
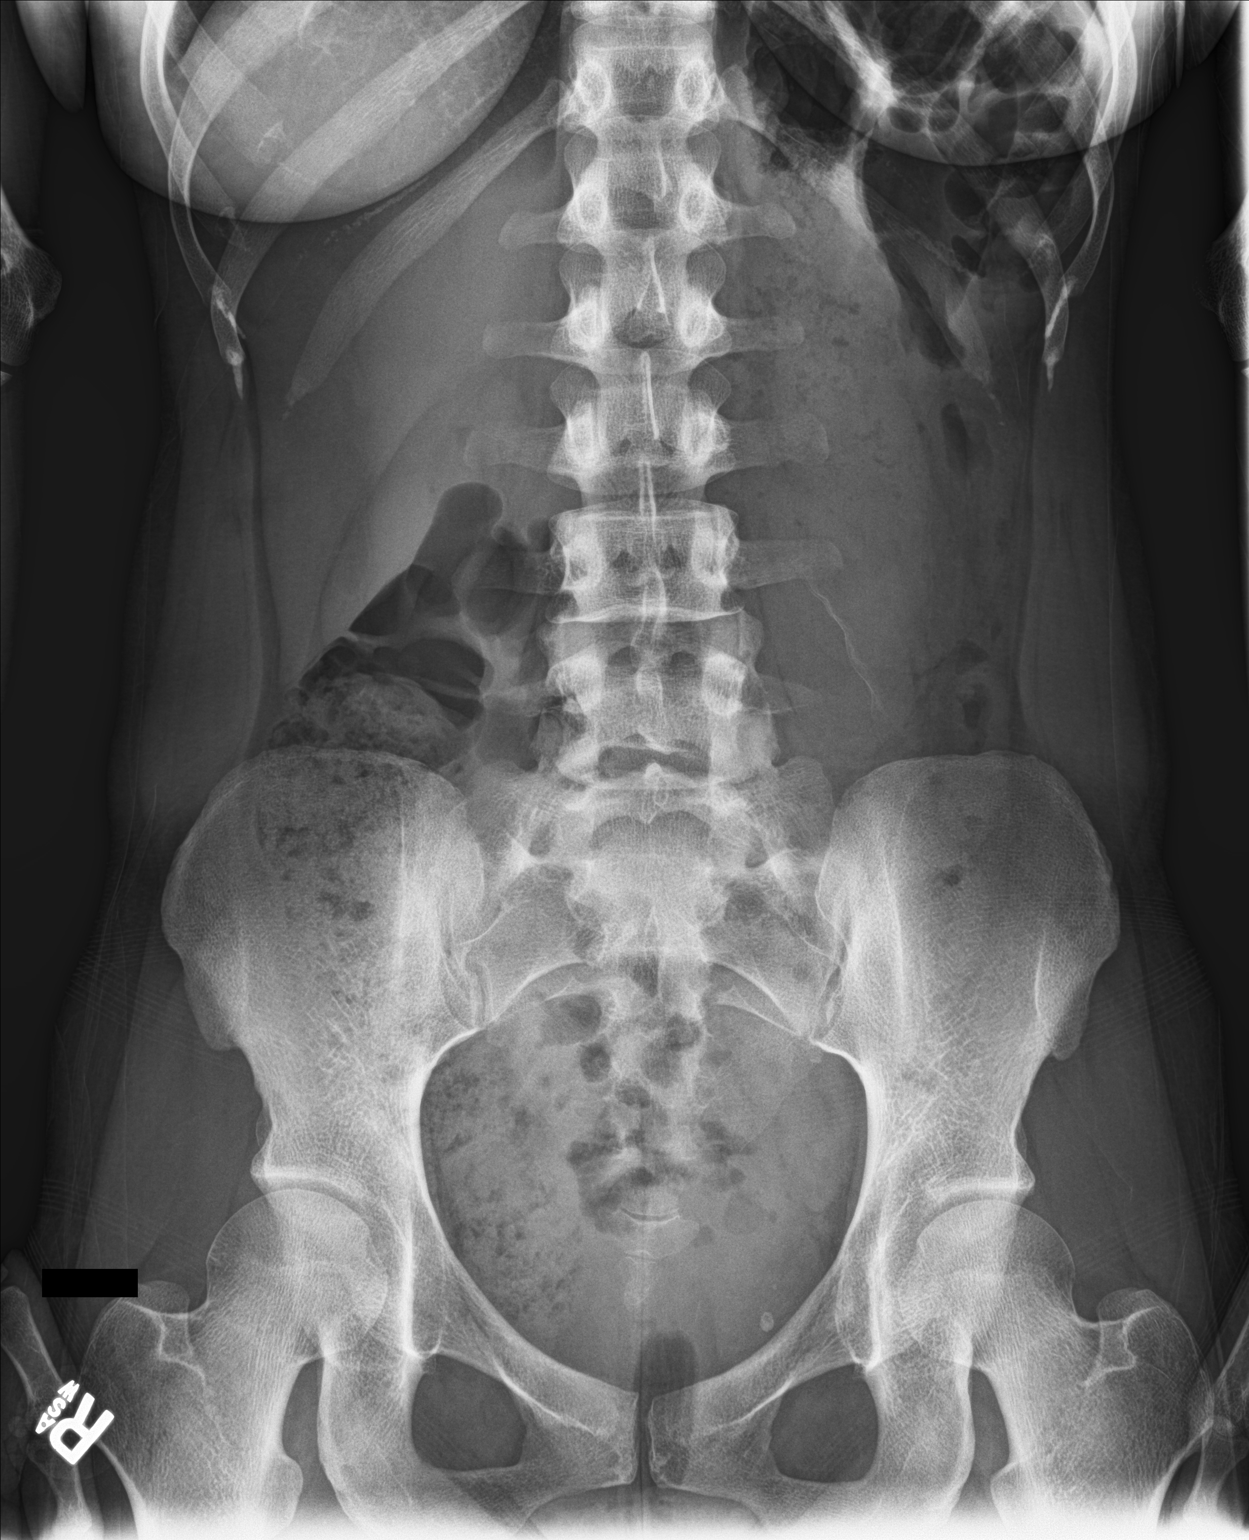

[abdomen supine]
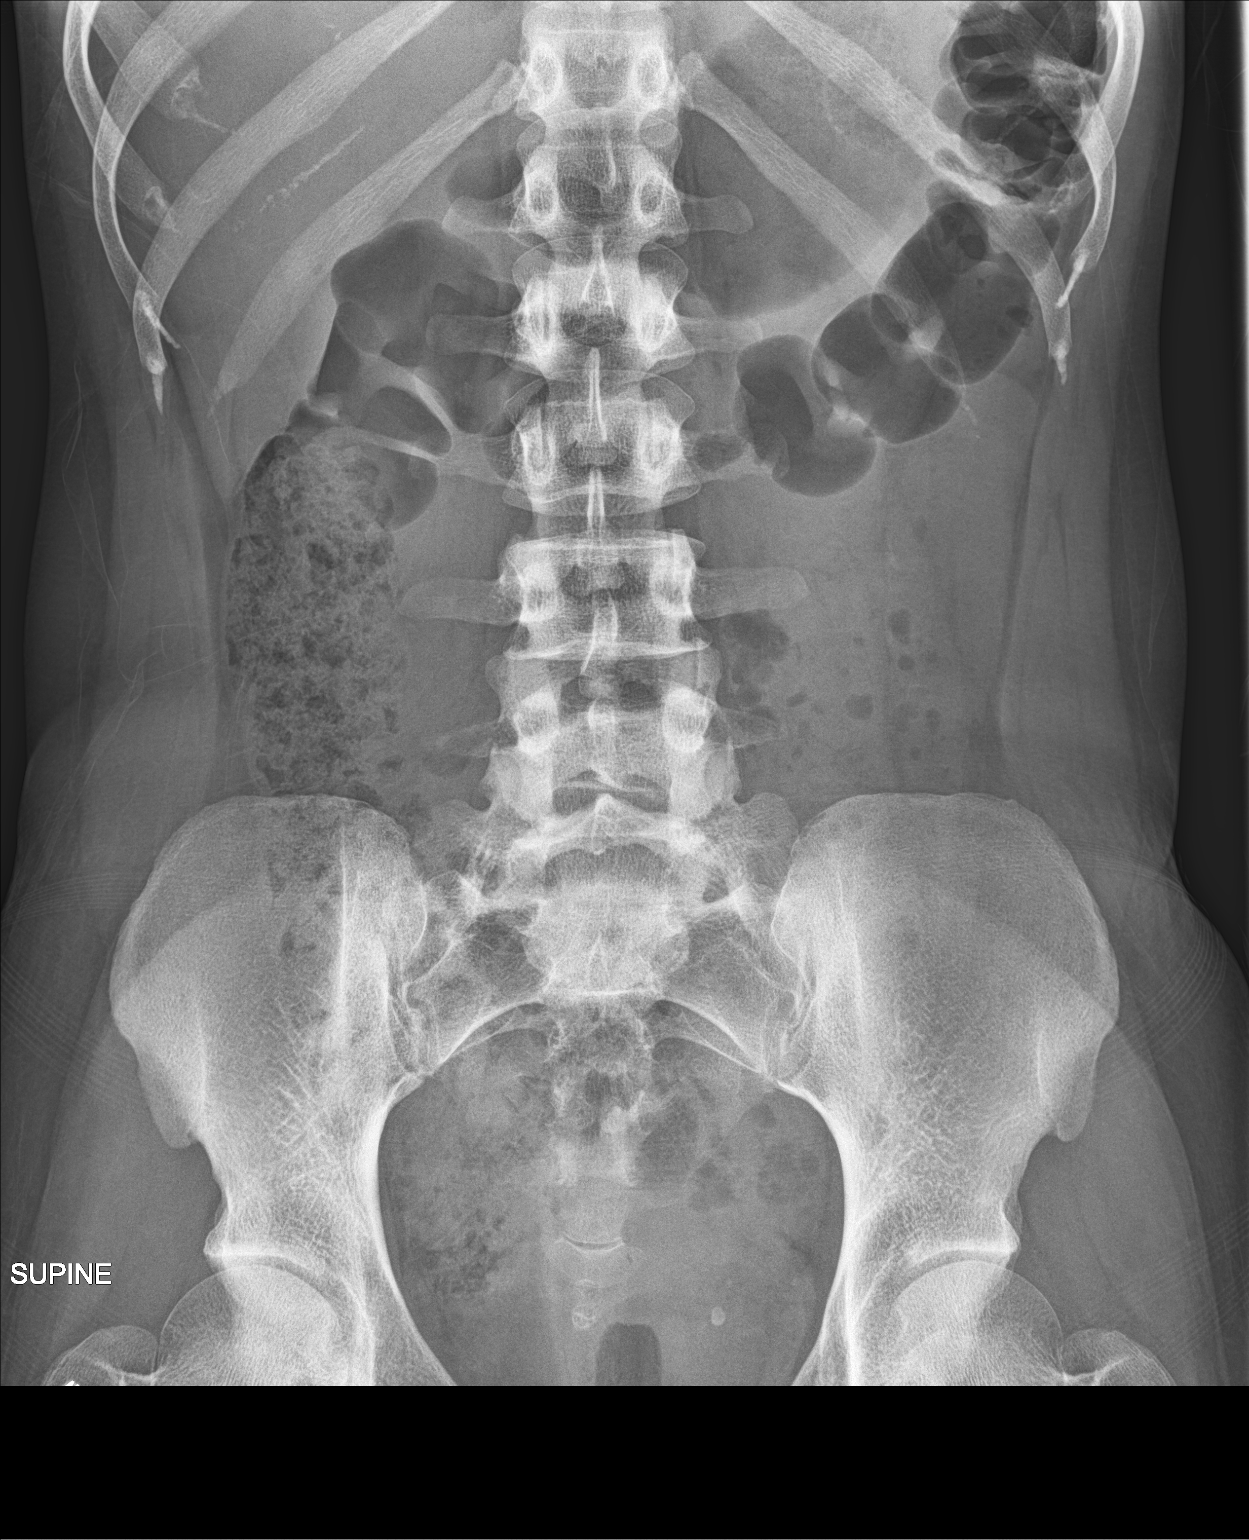

[3 of 3 positions shown; findings below may reference images not displayed]

FINDINGS: Nonobstructed bowel-gas pattern. Mild to moderate stool in the
colon. Phleboliths in the left pelvis. No free air beneath the
diaphragm.
IMPRESSION: Nonobstructed bowel gas pattern with moderate stool.

## 2021-01-02 ENCOUNTER — Telehealth: Payer: Self-pay

## 2021-01-02 NOTE — Telephone Encounter (Signed)
Patient is scheduled for 01/16/21 with [facility]S

## 2021-01-02 NOTE — Telephone Encounter (Signed)
[facility] referring for Consult to discuss fertility. Paper records. Attempt to reach patient voicemail not set up

## 2021-01-16 ENCOUNTER — Encounter: Payer: Self-pay | Admitting: Obstetrics and Gynecology

## 2021-02-09 ENCOUNTER — Encounter: Payer: Self-pay | Admitting: Obstetrics and Gynecology

## 2021-05-21 ENCOUNTER — Encounter: Payer: Self-pay | Admitting: Obstetrics and Gynecology

## 2021-06-12 ENCOUNTER — Encounter: Payer: Self-pay | Admitting: Obstetrics and Gynecology

## 2021-07-14 DIAGNOSIS — E785 Hyperlipidemia, unspecified: Secondary | ICD-10-CM | POA: Diagnosis not present

## 2021-07-14 DIAGNOSIS — R55 Syncope and collapse: Secondary | ICD-10-CM | POA: Diagnosis not present

## 2021-07-14 DIAGNOSIS — R69 Illness, unspecified: Secondary | ICD-10-CM | POA: Diagnosis not present

## 2021-08-06 DIAGNOSIS — N760 Acute vaginitis: Secondary | ICD-10-CM | POA: Diagnosis not present

## 2021-08-06 DIAGNOSIS — N390 Urinary tract infection, site not specified: Secondary | ICD-10-CM | POA: Diagnosis not present

## 2021-08-07 ENCOUNTER — Ambulatory Visit: Payer: Self-pay

## 2021-08-20 DIAGNOSIS — R5383 Other fatigue: Secondary | ICD-10-CM | POA: Diagnosis not present

## 2021-08-20 DIAGNOSIS — K219 Gastro-esophageal reflux disease without esophagitis: Secondary | ICD-10-CM | POA: Diagnosis not present

## 2021-08-20 DIAGNOSIS — D519 Vitamin B12 deficiency anemia, unspecified: Secondary | ICD-10-CM | POA: Diagnosis not present

## 2021-08-20 DIAGNOSIS — N390 Urinary tract infection, site not specified: Secondary | ICD-10-CM | POA: Diagnosis not present

## 2021-08-20 DIAGNOSIS — R69 Illness, unspecified: Secondary | ICD-10-CM | POA: Diagnosis not present

## 2021-08-20 DIAGNOSIS — E559 Vitamin D deficiency, unspecified: Secondary | ICD-10-CM | POA: Diagnosis not present

## 2021-08-31 DIAGNOSIS — F32A Depression, unspecified: Secondary | ICD-10-CM | POA: Diagnosis not present

## 2021-08-31 DIAGNOSIS — R69 Illness, unspecified: Secondary | ICD-10-CM | POA: Diagnosis not present

## 2021-08-31 DIAGNOSIS — F17200 Nicotine dependence, unspecified, uncomplicated: Secondary | ICD-10-CM | POA: Diagnosis not present

## 2021-09-03 ENCOUNTER — Ambulatory Visit: Payer: Self-pay | Admitting: Obstetrics and Gynecology

## 2021-09-14 DIAGNOSIS — K219 Gastro-esophageal reflux disease without esophagitis: Secondary | ICD-10-CM | POA: Diagnosis not present

## 2021-09-14 DIAGNOSIS — F411 Generalized anxiety disorder: Secondary | ICD-10-CM | POA: Diagnosis not present

## 2021-09-14 DIAGNOSIS — R69 Illness, unspecified: Secondary | ICD-10-CM | POA: Diagnosis not present

## 2021-09-14 DIAGNOSIS — F1410 Cocaine abuse, uncomplicated: Secondary | ICD-10-CM | POA: Diagnosis not present

## 2021-09-14 DIAGNOSIS — E559 Vitamin D deficiency, unspecified: Secondary | ICD-10-CM | POA: Diagnosis not present

## 2021-12-01 ENCOUNTER — Emergency Department
Admission: EM | Admit: 2021-12-01 | Discharge: 2021-12-01 | Disposition: A | Discharge status: Home / Self Care | Payer: 59 | Attending: Emergency Medicine | Admitting: Emergency Medicine

## 2021-12-01 ENCOUNTER — Other Ambulatory Visit: Payer: Self-pay

## 2021-12-01 ENCOUNTER — Emergency Department: Payer: 59

## 2021-12-01 DIAGNOSIS — S0990XA Unspecified injury of head, initial encounter: Secondary | ICD-10-CM

## 2021-12-01 DIAGNOSIS — S0003XA Contusion of scalp, initial encounter: Secondary | ICD-10-CM | POA: Diagnosis not present

## 2021-12-01 DIAGNOSIS — R69 Illness, unspecified: Secondary | ICD-10-CM | POA: Diagnosis not present

## 2021-12-01 MED ORDER — ACETAMINOPHEN 325 MG PO TABS
650.0000 mg | ORAL_TABLET | Freq: Once | ORAL | Status: AC
  Administered 2021-12-01: 650 mg via ORAL
  Filled 2021-12-01: qty 2

## 2021-12-01 MED ORDER — IBUPROFEN 600 MG PO TABS
600.0000 mg | ORAL_TABLET | Freq: Once | ORAL | Status: AC
  Administered 2021-12-01: 600 mg via ORAL
  Filled 2021-12-01: qty 1

## 2021-12-01 MED ORDER — IBUPROFEN 600 MG PO TABS
ORAL_TABLET | ORAL | Status: AC
  Filled 2021-12-01: qty 1

## 2021-12-01 MED ORDER — ONDANSETRON 4 MG PO TBDP: 4.0000 mg | ORAL_TABLET | Freq: Four times a day (QID) | ORAL | 0 refills | Status: DC | PRN
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
## 2021-12-04 DIAGNOSIS — K219 Gastro-esophageal reflux disease without esophagitis: Secondary | ICD-10-CM | POA: Diagnosis not present

## 2021-12-04 DIAGNOSIS — S8001XA Contusion of right knee, initial encounter: Secondary | ICD-10-CM | POA: Diagnosis not present

## 2021-12-04 DIAGNOSIS — S8002XA Contusion of left knee, initial encounter: Secondary | ICD-10-CM | POA: Diagnosis not present

## 2021-12-04 DIAGNOSIS — S0003XA Contusion of scalp, initial encounter: Secondary | ICD-10-CM | POA: Diagnosis not present

## 2021-12-04 DIAGNOSIS — R69 Illness, unspecified: Secondary | ICD-10-CM | POA: Diagnosis not present

## 2022-03-10 DIAGNOSIS — Z20822 Contact with and (suspected) exposure to covid-19: Secondary | ICD-10-CM | POA: Diagnosis not present

## 2022-03-10 DIAGNOSIS — R059 Cough, unspecified: Secondary | ICD-10-CM | POA: Diagnosis not present

## 2022-03-10 DIAGNOSIS — U071 COVID-19: Secondary | ICD-10-CM | POA: Diagnosis not present

## 2022-03-11 ENCOUNTER — Ambulatory Visit
Admission: EM | Admit: 2022-03-11 | Discharge: 2022-03-11 | Disposition: A | Discharge status: Home / Self Care | Payer: 59 | Attending: Family Medicine | Admitting: Family Medicine

## 2022-03-11 DIAGNOSIS — F17210 Nicotine dependence, cigarettes, uncomplicated: Secondary | ICD-10-CM | POA: Insufficient documentation

## 2022-03-11 DIAGNOSIS — J069 Acute upper respiratory infection, unspecified: Secondary | ICD-10-CM

## 2022-03-11 DIAGNOSIS — R058 Other specified cough: Secondary | ICD-10-CM | POA: Insufficient documentation

## 2022-03-11 DIAGNOSIS — R69 Illness, unspecified: Secondary | ICD-10-CM | POA: Diagnosis not present

## 2022-03-11 DIAGNOSIS — Z1152 Encounter for screening for COVID-19: Secondary | ICD-10-CM | POA: Diagnosis not present

## 2022-03-11 DIAGNOSIS — J3489 Other specified disorders of nose and nasal sinuses: Secondary | ICD-10-CM | POA: Diagnosis not present

## 2022-03-11 LAB — RESP PANEL BY RT-PCR (RSV, FLU A&B, COVID)  RVPGX2
Influenza A by PCR: NEGATIVE
Influenza B by PCR: NEGATIVE
Resp Syncytial Virus by PCR: NEGATIVE
SARS Coronavirus 2 by RT PCR: NEGATIVE

## 2022-03-11 NOTE — Discharge Instructions (Addendum)
Your COVID, flu and RSV test were negative. You can take Tylenol and/or Ibuprofen as needed for fever reduction and pain relief.    For cough: honey 1/2 to 1 teaspoon (you can dilute the honey in water or another fluid).  You can also use guaifenesin and dextromethorphan for cough. You can use a humidifier for chest congestion and cough.  If you don't have a humidifier, you can sit in the bathroom with the hot shower running.      For sore throat: try warm salt water gargles, Mucinex sore throat cough drops or cepacol lozenges, throat spray, warm tea or water with lemon/honey, popsicles or ice, or OTC cold relief medicine for throat discomfort. You can also purchase chloraseptic spray at the pharmacy or dollar store.   For congestion: take a daily anti-histamine like Zyrtec, Claritin, and a oral decongestant, such as pseudoephedrine.  You can also use Flonase 1-2 sprays in each nostril daily. Afrin is also a good option, if you do not have high blood pressure.    It is important to stay hydrated: drink plenty of fluids (water, gatorade/powerade/pedialyte, juices, or teas) to keep your throat moisturized and help further relieve irritation/discomfort.    Return or go to the Emergency Department if symptoms worsen or do not improve in the next few days

## 2022-03-11 NOTE — ED Provider Notes (Signed)
[facility]    CSN: [id number] Arrival date & time: 03/11/22  1620      History   Chief Complaint Chief Complaint  Patient presents with   Headache   Nasal Congestion    HPI TORREY HORSEMAN is a 32 y.o. female.   HPI   Courtlyn presents for headache and nasal congestion for the past 2 day. Endorses rhinorrhea. Dad recently had the flu. Took Nyquil and Dayquil with some relief. No shortness of breath, history of asthma, chest discomfort.    Fever : no Chills: no Sore throat: no   Cough: yes Sputum: no Nasal congestion : yes Rhinorrhea: yes Myalgias: no Appetite: normal  Hydration: normal  Abdominal pain: no Nausea: yes Vomiting: no Diarrhea: No Rash: No  Sleep disturbance: no Headache: yes     Past Medical History:  Diagnosis Date   Anxiety    No known health problems     There are no problems to display for this patient.   Past Surgical History:  Procedure Laterality Date   ESOPHAGOGASTRODUODENOSCOPY (EGD) WITH PROPOFOL N/A 09/16/2018   Procedure: ESOPHAGOGASTRODUODENOSCOPY (EGD) WITH PROPOFOL;  Surgeon: Toledo, Boykin Nearing, MD;  Location: [facility] ENDOSCOPY;  Service: Gastroenterology;  Laterality: N/A;   NO PAST SURGERIES     OTHER SURGICAL HISTORY     Hernia, 4-5 yrs old    OB History     Gravida  0   Para  0   Term  0   Preterm  0   AB  0   Living  0      SAB  0   IAB  0   Ectopic  0   Multiple  0   Live Births  0            Home Medications    Prior to Admission medications   Medication Sig Start Date End Date Taking? Authorizing Provider  cetirizine (ZYRTEC ALLERGY) 10 MG tablet Take 1 tablet (10 mg total) by mouth daily. 11/22/19   Tommie Sams, DO  ondansetron (ZOFRAN-ODT) 4 MG disintegrating tablet Take 1 tablet (4 mg total) by mouth every 6 (six) hours as needed for nausea or vomiting. 12/01/21   Sharyn Creamer, MD  pantoprazole (PROTONIX) 40 MG tablet  09/02/18   [provider]  Vitamin D,  Ergocalciferol, (DRISDOL) 1.25 MG (50000 UT) CAPS capsule Take by mouth. 03/05/18   [provider]  famotidine (PEPCID) 20 MG tablet Take 1 tablet (20 mg total) by mouth 2 (two) times daily for 15 days. 02/22/19 03/07/20  Dionne Bucy, MD  fluticasone (FLONASE) 50 MCG/ACT nasal spray Place 2 sprays into both nostrils daily. 02/21/18 11/22/19  Menshew, Charlesetta Ivory, PA-C  loratadine (CLARITIN) 10 MG tablet TK 1 T PO QAM 05/26/18 11/22/19  [provider]    Family History Family History  Problem Relation Age of Onset   Hypertension Mother    Healthy Father     Social History Social History   Tobacco Use   Smoking status: Every Day    Packs/day: 0.50    Types: Cigarettes   Smokeless tobacco: Never  Vaping Use   Vaping Use: Never used  Substance Use Topics   Alcohol use: Yes    Alcohol/week: 0.0 standard drinks of alcohol    Comment: socially   Drug use: No     Allergies   Patient has no known allergies.   Review of Systems Review of Systems: negative unless otherwise stated in HPI.  Physical Exam Triage Vital Signs ED Triage Vitals  Enc Vitals Group     BP 03/11/22 1736 111/84     Pulse Rate 03/11/22 1736 78     Resp --      Temp 03/11/22 1736 98.4 F (36.9 C)     Temp Source 03/11/22 1736 Oral     SpO2 03/11/22 1736 100 %     Weight 03/11/22 1735 125 lb (56.7 kg)     Height 03/11/22 1735 5\' 4"  (1.626 m)     Head Circumference --      Peak Flow --      Pain Score 03/11/22 1735 0     Pain Loc --      Pain Edu? --      Excl. in [place]? --    No data found.  Updated Vital Signs BP 111/84 (BP Location: Right Arm)   Pulse 78   Temp 98.4 F (36.9 C) (Oral)   Ht 5\' 4"  (1.626 m)   Wt 56.7 kg   LMP 02/08/2022 (Approximate)   SpO2 100%   BMI 21.46 kg/m   Visual Acuity Right Eye Distance:   Left Eye Distance:   Bilateral Distance:    Right Eye Near:   Left Eye Near:    Bilateral Near:     Physical Exam GEN:     alert,  non-toxic appearing female in no distress    HENT:  mucus membranes moist, oropharyngeal without erythema, lesions or exudate, no tonsillar hypertrophy, clear nasal discharge, EYES:   pupils equal and reactive, no scleral injection or discharge NECK:  normal ROM, no meningismus   RESP:  no increased work of breathing, clear to auscultation bilaterally CVS:   regular rate and rhythm Skin:   warm and dry    UC Treatments / Results  Labs (all labs ordered are listed, but only abnormal results are displayed) Labs Reviewed  RESP PANEL BY RT-PCR (RSV, FLU A&B, COVID)  [id number]    EKG   Radiology No results found.  Procedures Procedures (including critical care time)  Medications Ordered in UC Medications - No data to display  Initial Impression / Assessment and Plan / UC Course  I have reviewed the triage vital signs and the nursing notes.  Pertinent labs & imaging results that were available during my care of the patient were reviewed by me and considered in my medical decision making (see chart for details).       Pt is a 32 y.o. female who presents for 2 days of respiratory symptoms. Elinda is afebrile here without recent antipyretics. Satting well on room air. Overall pt is non-toxic appearing, well hydrated, without respiratory distress. Pulmonary exam is unremarkable.  COVID and influenza testing obtained. History most consistent with viral respiratory illness. COVID, RSV and influenza are negative.  Discussed symptomatic treatment.  Explained lack of efficacy of antibiotics in viral disease.  Typical duration of symptoms discussed.   Return and ED precautions given and voiced understanding. Discussed MDM, treatment plan and plan for follow-up with patient who agrees with plan.     Final Clinical Impressions(s) / UC Diagnoses   Final diagnoses:  Viral URI with cough     Discharge Instructions      Your COVID, flu and RSV test were negative. You can take Tylenol  and/or Ibuprofen as needed for fever reduction and pain relief.    For cough: honey 1/2 to 1 teaspoon (you can dilute the honey in water or another fluid).  You can also use guaifenesin and dextromethorphan for cough. You can use a humidifier for chest congestion and cough.  If you don't have a humidifier, you can sit in the bathroom with the hot shower running.      For sore throat: try warm salt water gargles, Mucinex sore throat cough drops or cepacol lozenges, throat spray, warm tea or water with lemon/honey, popsicles or ice, or OTC cold relief medicine for throat discomfort. You can also purchase chloraseptic spray at the pharmacy or dollar store.   For congestion: take a daily anti-histamine like Zyrtec, Claritin, and a oral decongestant, such as pseudoephedrine.  You can also use Flonase 1-2 sprays in each nostril daily. Afrin is also a good option, if you do not have high blood pressure.    It is important to stay hydrated: drink plenty of fluids (water, gatorade/powerade/pedialyte, juices, or teas) to keep your throat moisturized and help further relieve irritation/discomfort.    Return or go to the Emergency Department if symptoms worsen or do not improve in the next few days      ED Prescriptions   None    PDMP not reviewed this encounter.   Lyndee Hensen, DO 03/11/22 1828

## 2022-03-11 NOTE — ED Triage Notes (Signed)
Pt c/o headache & runny nose onset x2 days ago

## 2022-03-14 DIAGNOSIS — R69 Illness, unspecified: Secondary | ICD-10-CM | POA: Diagnosis not present

## 2022-03-14 DIAGNOSIS — Z3162 Encounter for fertility preservation counseling: Secondary | ICD-10-CM | POA: Diagnosis not present

## 2022-03-14 DIAGNOSIS — E782 Mixed hyperlipidemia: Secondary | ICD-10-CM | POA: Diagnosis not present

## 2022-03-25 ENCOUNTER — Ambulatory Visit
Admission: EM | Admit: 2022-03-25 | Discharge: 2022-03-25 | Disposition: A | Discharge status: Home / Self Care | Payer: 59 | Attending: Emergency Medicine | Admitting: Emergency Medicine

## 2022-03-25 ENCOUNTER — Other Ambulatory Visit: Payer: Self-pay

## 2022-03-25 DIAGNOSIS — H66003 Acute suppurative otitis media without spontaneous rupture of ear drum, bilateral: Secondary | ICD-10-CM

## 2022-03-25 MED ORDER — AMOXICILLIN-POT CLAVULANATE 875-125 MG PO TABS: 1.0000 | ORAL_TABLET | Freq: Two times a day (BID) | ORAL | 0 refills | Status: AC

## 2022-03-25 NOTE — ED Triage Notes (Signed)
2-3 days of bilateral ear pain and pain radiating down into neck

## 2022-03-25 NOTE — ED Provider Notes (Signed)
[facility]    CSN: [id number] Arrival date & time: 03/25/22  1729      History   Chief Complaint Chief Complaint  Patient presents with   Otalgia    HPI Breanna Lawrence is a 32 y.o. female.   HPI  32 year old female here for evaluation of bilateral ear pain.  The patient reports that she has been experiencing pain in both of her ears that radiates down the side of her neck for the last 2 to 3 days.  This is not associate with any fever, runny nose or nasal congestion.  She also denies ringing in your ears.  Past Medical History:  Diagnosis Date   Anxiety    No known health problems     There are no problems to display for this patient.   Past Surgical History:  Procedure Laterality Date   ESOPHAGOGASTRODUODENOSCOPY (EGD) WITH PROPOFOL N/A 09/16/2018   Procedure: ESOPHAGOGASTRODUODENOSCOPY (EGD) WITH PROPOFOL;  Surgeon: Toledo, Benay Pike, MD;  Location: [facility] ENDOSCOPY;  Service: Gastroenterology;  Laterality: N/A;   NO PAST SURGERIES     OTHER SURGICAL HISTORY     Hernia, 4-5 yrs old    OB History     Gravida  0   Para  0   Term  0   Preterm  0   AB  0   Living  0      SAB  0   IAB  0   Ectopic  0   Multiple  0   Live Births  0            Home Medications    Prior to Admission medications   Medication Sig Start Date End Date Taking? Authorizing Provider  amoxicillin-clavulanate (AUGMENTIN) 875-125 MG tablet Take 1 tablet by mouth every 12 (twelve) hours for 10 days. 03/25/22 04/04/22 Yes Margarette Canada, NP  famotidine (PEPCID) 20 MG tablet Take 1 tablet (20 mg total) by mouth 2 (two) times daily for 15 days. 02/22/19 03/07/20  Arta Silence, MD  fluticasone (FLONASE) 50 MCG/ACT nasal spray Place 2 sprays into both nostrils daily. 02/21/18 11/22/19  Menshew, Dannielle Karvonen, PA-C  loratadine (CLARITIN) 10 MG tablet TK 1 T PO QAM 05/26/18 11/22/19  [provider]    Family History Family History  Problem Relation  Age of Onset   Hypertension Mother    Healthy Father     Social History Social History   Tobacco Use   Smoking status: Every Day    Packs/day: 0.50    Types: Cigarettes   Smokeless tobacco: Never  Vaping Use   Vaping Use: Never used  Substance Use Topics   Alcohol use: Yes    Alcohol/week: 0.0 standard drinks of alcohol    Comment: socially   Drug use: No     Allergies   Patient has no known allergies.   Review of Systems Review of Systems  Constitutional:  Negative for fever.  HENT:  Positive for ear pain. Negative for ear discharge, hearing loss and tinnitus.      Physical Exam Triage Vital Signs ED Triage Vitals  Enc Vitals Group     BP 03/25/22 1752 108/69     Pulse Rate 03/25/22 1752 75     Resp 03/25/22 1752 16     Temp 03/25/22 1752 98.2 F (36.8 C)     Temp Source 03/25/22 1752 Oral     SpO2 03/25/22 1752 96 %     Weight 03/25/22 1752 130  lb (59 kg)     Height 03/25/22 1752 5\' 4"  (1.626 m)     Head Circumference --      Peak Flow --      Pain Score 03/25/22 1749 6     Pain Loc --      Pain Edu? --      Excl. in GC? --    No data found.  Updated Vital Signs BP 108/69 (BP Location: Right Arm)   Pulse 75   Temp 98.2 F (36.8 C) (Oral)   Resp 16   Ht 5\' 4"  (1.626 m)   Wt 130 lb (59 kg)   LMP 03/18/2022 (Approximate)   SpO2 96%   BMI 22.31 kg/m   Visual Acuity Right Eye Distance:   Left Eye Distance:   Bilateral Distance:    Right Eye Near:   Left Eye Near:    Bilateral Near:     Physical Exam Vitals and nursing note reviewed.  Constitutional:      Appearance: Normal appearance. She is not ill-appearing.  HENT:     Head: Normocephalic and atraumatic.     Right Ear: Ear canal and external ear normal. There is no impacted cerumen.     Left Ear: Ear canal and external ear normal. There is no impacted cerumen.     Ears:     Comments: Bilateral tympanic membranes are erythematous and injected.  Both external auditory canals are  clear. Skin:    General: Skin is warm and dry.     Capillary Refill: Capillary refill takes less than 2 seconds.     Findings: No erythema or rash.  Neurological:     General: No focal deficit present.     Mental Status: She is alert and oriented to person, place, and time.  Psychiatric:        Mood and Affect: Mood normal.        Behavior: Behavior normal.        Thought Content: Thought content normal.        Judgment: Judgment normal.      UC Treatments / Results  Labs (all labs ordered are listed, but only abnormal results are displayed) Labs Reviewed - No data to display  EKG   Radiology No results found.  Procedures Procedures (including critical care time)  Medications Ordered in UC Medications - No data to display  Initial Impression / Assessment and Plan / UC Course  I have reviewed the triage vital signs and the nursing notes.  Pertinent labs & imaging results that were available during my care of the patient were reviewed by me and considered in my medical decision making (see chart for details).   Patient presents for evaluation of 2 to 3 days worth of bilateral ear pain that radiates down both sides of the neck behind the jaw.  This is not associate with any fever or ringing in the ears.  She denies any changes to her hearing.  No runny nose nasal congestion.  On exam both tympanic membranes are erythematous and injected and both external auditory canals are clear.  The patient's exam is consistent with bilateral otitis media.  I will treat her with Augmentin 875 twice daily with food for 10 days.  Over-the-counter Tylenol and ibuprofen as needed for pain.  Return precautions reviewed.  Work note provided.   Final Clinical Impressions(s) / UC Diagnoses   Final diagnoses:  Non-recurrent acute suppurative otitis media of both ears without spontaneous rupture of tympanic  membranes     Discharge Instructions      Take the Augmentin twice daily for 10 days  with food for treatment of your ear infection.  Take an over-the-counter probiotic 1 hour after each dose of antibiotic to prevent diarrhea.  Use over-the-counter Tylenol and ibuprofen as needed for pain or fever.  Place a hot water bottle, or heating pad, underneath your pillowcase at night to help dilate up your ear and aid in pain relief as well as resolution of the infection.  Return for reevaluation for any new or worsening symptoms.      ED Prescriptions     Medication Sig Dispense Auth. Provider   amoxicillin-clavulanate (AUGMENTIN) 875-125 MG tablet Take 1 tablet by mouth every 12 (twelve) hours for 10 days. 20 tablet Margarette Canada, NP      PDMP not reviewed this encounter.   Margarette Canada, NP 03/25/22 [phone]

## 2022-03-25 NOTE — Discharge Instructions (Signed)
Take the Augmentin twice daily for 10 days with food for treatment of your ear infection.  Take an over-the-counter probiotic 1 hour after each dose of antibiotic to prevent diarrhea.  Use over-the-counter Tylenol and ibuprofen as needed for pain or fever.  Place a hot water bottle, or heating pad, underneath your pillowcase at night to help dilate up your ear and aid in pain relief as well as resolution of the infection.  Return for reevaluation for any new or worsening symptoms.  

## 2022-04-10 ENCOUNTER — Ambulatory Visit: Payer: 59 | Admitting: Licensed Practical Nurse

## 2022-04-10 VITALS — BP 112/79 | HR 87 | Wt 133.5 lb

## 2022-04-10 DIAGNOSIS — Z3169 Encounter for other general counseling and advice on procreation: Secondary | ICD-10-CM | POA: Diagnosis not present

## 2022-04-10 NOTE — Progress Notes (Signed)
SUBJECTIVE: Here with her mother to discuss fertility assistance.  Redd is in a same sex relationship, she and her partner desire a pregnancy. Redd would be the carrier. Denies any significant medical History.  She does smoke 4-5 cigarettes  a day.  She has been tracking her cycles with an app and OPK, she does have signs of ovulation. She has been researching sperm donor sites. She would like to discuss IUI or ICI   OBJECTIVE: BP 112/79   Pulse 87   Wt 133 lb 8 oz (60.6 kg)   LMP 03/18/2022 (Approximate)   BMI 22.92 kg/m  Gen: NAD, healthy appearing PE not needed for this visit   ASSESSMENT:  Encounter for general advice and counseling  on procreation   PLAN: Reviewed IUI and ICI are not offered in this clinic.  She and her partner may choose to ICI at home-she would be responsible to acquiring the materials. She may also go to a clinic that offers reproductive assistance. Pt desires referral to [facility].   -remained pt to start PNV and folic acid, decrease smoking with a plan to stop.   Roberto Scales, [facility]  04/12/22  9:56 AM

## 2022-05-03 DIAGNOSIS — Z6822 Body mass index (BMI) 22.0-22.9, adult: Secondary | ICD-10-CM | POA: Diagnosis not present

## 2022-05-03 DIAGNOSIS — Z319 Encounter for procreative management, unspecified: Secondary | ICD-10-CM | POA: Diagnosis not present

## 2022-05-16 DIAGNOSIS — Z319 Encounter for procreative management, unspecified: Secondary | ICD-10-CM | POA: Diagnosis not present

## 2022-05-16 DIAGNOSIS — N979 Female infertility, unspecified: Secondary | ICD-10-CM | POA: Diagnosis not present

## 2022-08-21 ENCOUNTER — Ambulatory Visit
Admission: EM | Admit: 2022-08-21 | Discharge: 2022-08-21 | Disposition: A | Discharge status: Home / Self Care | Payer: 59 | Attending: Family Medicine | Admitting: Family Medicine

## 2022-08-21 DIAGNOSIS — K529 Noninfective gastroenteritis and colitis, unspecified: Secondary | ICD-10-CM | POA: Insufficient documentation

## 2022-08-21 LAB — CBC WITH DIFFERENTIAL/PLATELET
Abs Immature Granulocytes: 0.03 10*3/uL (ref 0.00–0.07)
Basophils Absolute: 0.1 10*3/uL (ref 0.0–0.1)
Basophils Relative: 1 %
Eosinophils Absolute: 0.2 10*3/uL (ref 0.0–0.5)
Eosinophils Relative: 2 %
HCT: 39.3 % (ref 36.0–46.0)
Hemoglobin: 13.5 g/dL (ref 12.0–15.0)
Immature Granulocytes: 0 %
Lymphocytes Relative: 17 %
Lymphs Abs: 1.6 10*3/uL (ref 0.7–4.0)
MCH: 29.9 pg (ref 26.0–34.0)
MCHC: 34.4 g/dL (ref 30.0–36.0)
MCV: 86.9 fL (ref 80.0–100.0)
Monocytes Absolute: 0.5 10*3/uL (ref 0.1–1.0)
Monocytes Relative: 6 %
Neutro Abs: 7.1 10*3/uL (ref 1.7–7.7)
Neutrophils Relative %: 74 %
Platelets: 284 10*3/uL (ref 150–400)
RBC: 4.52 MIL/uL (ref 3.87–5.11)
RDW: 13.8 % (ref 11.5–15.5)
WBC: 9.5 10*3/uL (ref 4.0–10.5)
nRBC: 0 % (ref 0.0–0.2)

## 2022-08-21 LAB — COMPREHENSIVE METABOLIC PANEL
ALT: 12 U/L (ref 0–44)
AST: 20 U/L (ref 15–41)
Albumin: 4.2 g/dL (ref 3.5–5.0)
Alkaline Phosphatase: 68 U/L (ref 38–126)
Anion gap: 8 (ref 5–15)
BUN: 10 mg/dL (ref 6–20)
CO2: 26 mmol/L (ref 22–32)
Calcium: 8.9 mg/dL (ref 8.9–10.3)
Chloride: 101 mmol/L (ref 98–111)
Creatinine, Ser: 0.71 mg/dL (ref 0.44–1.00)
GFR, Estimated: >60 mL/min (ref >60)
Glucose, Bld: 76 mg/dL (ref 70–99)
Potassium: 3.6 mmol/L (ref 3.5–5.1)
Sodium: 135 mmol/L (ref 135–145)
Total Bilirubin: 0.4 mg/dL (ref 0.3–1.2)
Total Protein: 7.8 g/dL (ref 6.5–8.1)

## 2022-08-21 LAB — URINALYSIS, ROUTINE W REFLEX MICROSCOPIC
Bilirubin Urine: NEGATIVE
Glucose, UA: NEGATIVE mg/dL
Hgb urine dipstick: NEGATIVE
Ketones, ur: NEGATIVE mg/dL
Leukocytes,Ua: NEGATIVE
Nitrite: NEGATIVE
Protein, ur: NEGATIVE mg/dL
Specific Gravity, Urine: 1.02 (ref 1.005–1.030)
pH: 7.5 (ref 5.0–8.0)

## 2022-08-21 LAB — LIPASE, BLOOD: Lipase: 30 U/L (ref 11–51)

## 2022-08-21 MED ORDER — ONDANSETRON 4 MG PO TBDP
4.0000 mg | ORAL_TABLET | Freq: Once | ORAL | Status: AC
  Administered 2022-08-21: 4 mg via ORAL

## 2022-08-21 MED ORDER — ONDANSETRON 4 MG PO TBDP: 4.0000 mg | ORAL_TABLET | Freq: Three times a day (TID) | ORAL | 0 refills | Status: AC | PRN

## 2022-08-21 NOTE — ED Triage Notes (Signed)
Pt c/o vomiting, diarrhea onset today, pt states she had some brussel sprouts and salad last night. Pt states she also has abdominal pain all over.

## 2022-08-21 NOTE — ED Provider Notes (Signed)
[facility]    CSN: [id number] Arrival date & time: 08/21/22  1354      History   Chief Complaint Chief Complaint  Patient presents with   Diarrhea    HPI Breanna Lawrence is a 32 y.o. female.   HPI History provided by mom and patient.  Breanna Lawrence presents for lower abdominal pain, vomiting and diarrhea that started early this morning. 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 No bloody diarrhea or emesis. Vomited 3 times and 9 times of watery diarrhea.    Past Surgeries: none  Symptoms Nausea/Vomiting: yes  Diarrhea: yes  Constipation: no  Melena/BRBPR: no  Hematemesis: yes  Anorexia: yes  Fever/Chills: no  Dysuria: yes  Rash: no  Wt loss: no  Patient's last menstrual period was 08/19/2022 (approximate). Vaginal bleeding: no  Sore throat: no   Cough: no Nasal congestion : no  Sleep disturbance: no Back Pain: no Headache: no   Past Medical History:  Diagnosis Date   Anxiety    No known health problems     There are no problems to display for this patient.   Past Surgical History:  Procedure Laterality Date   ESOPHAGOGASTRODUODENOSCOPY (EGD) WITH PROPOFOL N/A 09/16/2018   Procedure: ESOPHAGOGASTRODUODENOSCOPY (EGD) WITH PROPOFOL;  Surgeon: Toledo, Boykin Nearing, MD;  Location: [facility] ENDOSCOPY;  Service: Gastroenterology;  Laterality: N/A;   NO PAST SURGERIES     OTHER SURGICAL HISTORY     Hernia, 4-5 yrs old    OB History     Gravida  0   Para  0   Term  0   Preterm  0   AB  0   Living  0      SAB  0   IAB  0   Ectopic  0   Multiple  0   Live Births  0            Home Medications    Prior to Admission medications   Medication Sig Start Date End Date Taking? Authorizing Provider  ondansetron (ZOFRAN-ODT) 4 MG disintegrating tablet Take 1 tablet (4 mg total) by mouth every 8 (eight) hours as needed. 08/21/22  Yes Sarina Robleto, DO  famotidine (PEPCID) 20 MG tablet Take 1 tablet (20 mg total)  by mouth 2 (two) times daily for 15 days. 02/22/19 03/07/20  Dionne Bucy, MD  fluticasone (FLONASE) 50 MCG/ACT nasal spray Place 2 sprays into both nostrils daily. 02/21/18 11/22/19  Menshew, Charlesetta Ivory, PA-C  loratadine (CLARITIN) 10 MG tablet TK 1 T PO QAM 05/26/18 11/22/19  [provider]    Family History Family History  Problem Relation Age of Onset   Hypertension Mother    Healthy Father     Social History Social History   Tobacco Use   Smoking status: Every Day    Packs/day: .5    Types: Cigarettes   Smokeless tobacco: Never  Vaping Use   Vaping Use: Never used  Substance Use Topics   Alcohol use: Yes    Alcohol/week: 0.0 standard drinks of alcohol    Comment: socially   Drug use: No     Allergies   Patient has no known allergies.   Review of Systems Review of Systems :negative unless otherwise stated in HPI.      Physical Exam Triage Vital Signs ED Triage Vitals  Enc Vitals Group     BP 08/21/22 1412 119/86     Pulse Rate 08/21/22 1412 64  Resp --      Temp 08/21/22 1412 (!) 97.5 F (36.4 C)     Temp Source 08/21/22 1412 Oral     SpO2 08/21/22 1412 99 %     Weight --      Height --      Head Circumference --      Peak Flow --      Pain Score 08/21/22 1411 8     Pain Loc --      Pain Edu? --      Excl. in GC? --    No data found.  Updated Vital Signs BP 119/86 (BP Location: Right Arm)   Pulse 64   Temp (!) 97.5 F (36.4 C) (Oral)   LMP 08/19/2022 (Approximate)   SpO2 99%   Visual Acuity Right Eye Distance:   Left Eye Distance:   Bilateral Distance:    Right Eye Near:   Left Eye Near:    Bilateral Near:     Physical Exam  GEN: non-toxic appearing female, in no acute distress  CV: regular rate and rhythm, no murmurs appreciated  RESP: no increased work of breathing, clear to ascultation bilaterally ABD: Bowel sounds present. Soft, mild to moderate generalized tenderness, non-distended.  No guarding, no  rebound, no appreciable hepatosplenomegaly, no CVA tenderness, negative McBurney's, negative Murphy MSK: no extremity edema SKIN: warm, dry, no rash on visible skin NEURO: alert, moves all extremities appropriately PSYCH: Normal affect, appropriate speech and behavior   UC Treatments / Results  Labs (all labs ordered are listed, but only abnormal results are displayed) Labs Reviewed  URINALYSIS, ROUTINE W REFLEX MICROSCOPIC  CBC WITH DIFFERENTIAL/PLATELET  COMPREHENSIVE METABOLIC PANEL  LIPASE, BLOOD    EKG  If EKG performed, see my interpretation and MDM section  Radiology No results found.   Procedures Procedures (including critical care time)  Medications Ordered in UC Medications  ondansetron (ZOFRAN-ODT) disintegrating tablet 4 mg (4 mg Oral Given 08/21/22 1520)    Initial Impression / Assessment and Plan / UC Course  I have reviewed the triage vital signs and the nursing notes.  Pertinent labs & imaging results that were available during my care of the patient were reviewed by me and considered in my medical decision making (see chart for details).        Patient is a  32 y.o. female who presents after having insidious severe abdominal pain with vomiting and diarrhea.  Overall, patient is non-toxic-appearing, well-hydrated, and in no acute distress.  Vital signs stable.  Shcarieis afebrile.  Exam is not concerning for an acute abdomen.  Obtained UA, CBC, CMP, and lipase which were all normal.  Doubt acute cholecystitis, acute cystitis, pyelonephritis, pancreatitis or appendicitis.  Patient given Zofran with some improvement. History suggests viral gastroenteritis. Prescribed Zofran for home use. Work note provided. Food choices handout for gastrointestinal issues given.    Follow-up, return and ED precautions given.  Discussed MDM, treatment plan and plan for follow-up with patient who agrees with plan.    Final Clinical Impressions(s) / UC Diagnoses   Final  diagnoses:  Gastroenteritis     Discharge Instructions      Your blood work was all normal.  Your electrolytes are normal. Your kidney, liver, pancreas are all functioning normal.  You do not have an elevated white blood cell count, which is a marker for infection or inflammation.  See handout for food choices to help relieve diarrhea. I sent some Zofran (for nausea) to your pharmacy.  ED Prescriptions     Medication Sig Dispense Auth. Provider   ondansetron (ZOFRAN-ODT) 4 MG disintegrating tablet Take 1 tablet (4 mg total) by mouth every 8 (eight) hours as needed. 20 tablet Katha Cabal, DO      PDMP not reviewed this encounter.   Katha Cabal, DO 08/21/22 1627

## 2022-08-21 NOTE — Discharge Instructions (Addendum)
Your blood work was all normal.  Your electrolytes are normal. Your kidney, liver, pancreas are all functioning normal.  You do not have an elevated white blood cell count, which is a marker for infection or inflammation.  See handout for food choices to help relieve diarrhea. I sent some Zofran (for nausea) to your pharmacy.

## 2022-08-23 ENCOUNTER — Ambulatory Visit: Payer: 59 | Admitting: Family

## 2022-09-23 DIAGNOSIS — F411 Generalized anxiety disorder: Secondary | ICD-10-CM | POA: Diagnosis not present

## 2022-09-23 DIAGNOSIS — F331 Major depressive disorder, recurrent, moderate: Secondary | ICD-10-CM | POA: Diagnosis not present

## 2022-09-23 DIAGNOSIS — F1410 Cocaine abuse, uncomplicated: Secondary | ICD-10-CM | POA: Diagnosis not present

## 2022-09-24 ENCOUNTER — Ambulatory Visit: Payer: 59 | Admitting: Family

## 2022-09-27 ENCOUNTER — Ambulatory Visit (INDEPENDENT_AMBULATORY_CARE_PROVIDER_SITE_OTHER): Payer: 59 | Admitting: Family

## 2022-09-27 VITALS — BP 100/70 | HR 78 | Ht 64.0 in | Wt 124.6 lb

## 2022-09-27 DIAGNOSIS — R7303 Prediabetes: Secondary | ICD-10-CM

## 2022-09-27 DIAGNOSIS — I10 Essential (primary) hypertension: Secondary | ICD-10-CM | POA: Diagnosis not present

## 2022-09-27 DIAGNOSIS — E538 Deficiency of other specified B group vitamins: Secondary | ICD-10-CM | POA: Diagnosis not present

## 2022-09-27 DIAGNOSIS — E559 Vitamin D deficiency, unspecified: Secondary | ICD-10-CM

## 2022-09-27 DIAGNOSIS — R5383 Other fatigue: Secondary | ICD-10-CM

## 2022-09-27 DIAGNOSIS — E782 Mixed hyperlipidemia: Secondary | ICD-10-CM

## 2022-09-27 DIAGNOSIS — E039 Hypothyroidism, unspecified: Secondary | ICD-10-CM

## 2022-09-27 DIAGNOSIS — R300 Dysuria: Secondary | ICD-10-CM | POA: Diagnosis not present

## 2022-09-27 LAB — POCT URINALYSIS DIPSTICK
Bilirubin, UA: NEGATIVE
Blood, UA: NEGATIVE
Glucose, UA: NEGATIVE
Ketones, UA: NEGATIVE
Leukocytes, UA: NEGATIVE
Nitrite, UA: NEGATIVE
Protein, UA: NEGATIVE
Spec Grav, UA: 1.015 (ref 1.010–1.025)
Urobilinogen, UA: 0.2 E.U./dL
pH, UA: 6 (ref 5.0–8.0)

## 2022-09-27 MED ORDER — VILAZODONE HCL 10 MG PO TABS: 10.0000 mg | ORAL_TABLET | Freq: Every day | ORAL | 1 refills | Status: DC

## 2022-10-07 DIAGNOSIS — F1410 Cocaine abuse, uncomplicated: Secondary | ICD-10-CM | POA: Diagnosis not present

## 2022-10-07 DIAGNOSIS — F331 Major depressive disorder, recurrent, moderate: Secondary | ICD-10-CM | POA: Diagnosis not present

## 2022-10-07 DIAGNOSIS — F411 Generalized anxiety disorder: Secondary | ICD-10-CM | POA: Diagnosis not present

## 2022-10-09 DIAGNOSIS — F331 Major depressive disorder, recurrent, moderate: Secondary | ICD-10-CM | POA: Diagnosis not present

## 2022-10-09 DIAGNOSIS — F411 Generalized anxiety disorder: Secondary | ICD-10-CM | POA: Diagnosis not present

## 2022-10-09 DIAGNOSIS — F1410 Cocaine abuse, uncomplicated: Secondary | ICD-10-CM | POA: Diagnosis not present

## 2022-10-10 DIAGNOSIS — F411 Generalized anxiety disorder: Secondary | ICD-10-CM | POA: Diagnosis not present

## 2022-10-10 DIAGNOSIS — F331 Major depressive disorder, recurrent, moderate: Secondary | ICD-10-CM | POA: Diagnosis not present

## 2022-10-10 DIAGNOSIS — F1410 Cocaine abuse, uncomplicated: Secondary | ICD-10-CM | POA: Diagnosis not present

## 2022-10-11 ENCOUNTER — Other Ambulatory Visit: Payer: Self-pay

## 2022-10-11 MED ORDER — VITAMIN D (ERGOCALCIFEROL) 1.25 MG (50000 UNIT) PO CAPS: 50000.0000 [IU] | ORAL_CAPSULE | ORAL | 3 refills | Status: DC

## 2022-10-14 DIAGNOSIS — F411 Generalized anxiety disorder: Secondary | ICD-10-CM | POA: Diagnosis not present

## 2022-10-14 DIAGNOSIS — F1410 Cocaine abuse, uncomplicated: Secondary | ICD-10-CM | POA: Diagnosis not present

## 2022-10-14 DIAGNOSIS — F331 Major depressive disorder, recurrent, moderate: Secondary | ICD-10-CM | POA: Diagnosis not present

## 2022-10-16 DIAGNOSIS — F411 Generalized anxiety disorder: Secondary | ICD-10-CM | POA: Diagnosis not present

## 2022-10-16 DIAGNOSIS — F1410 Cocaine abuse, uncomplicated: Secondary | ICD-10-CM | POA: Diagnosis not present

## 2022-10-16 DIAGNOSIS — F331 Major depressive disorder, recurrent, moderate: Secondary | ICD-10-CM | POA: Diagnosis not present

## 2022-10-17 ENCOUNTER — Encounter: Payer: Self-pay | Admitting: Family

## 2022-10-17 NOTE — Progress Notes (Signed)
Established Patient Office Visit  Subjective:  Patient ID: Breanna Lawrence, female    DOB: [birth year]  Age: 32 y.o. MRN: [id number]  Chief Complaint  Patient presents with   Follow-up    UTI    Patient is here today for her follow up appointment. She has been feeling fairly well since last appointment.   She does have additional concerns to discuss today.    Dysuria  This is a new problem. The current episode started in the past 7 days. The problem occurs every urination. The problem has been waxing and waning. The quality of the pain is described as burning. The pain is moderate. There has been no fever. She is Sexually active. There is No history of pyelonephritis. Associated symptoms include frequency. She has tried nothing for the symptoms. The treatment provided no relief.   Labs are due today. She needs refills.   I have reviewed her active problem list, medication list, allergies, notes from last encounter, lab results for her appointment today.   No other concerns at this time.   Past Medical History:  Diagnosis Date   Anxiety    No known health problems     Past Surgical History:  Procedure Laterality Date   ESOPHAGOGASTRODUODENOSCOPY (EGD) WITH PROPOFOL N/A 09/16/2018   Procedure: ESOPHAGOGASTRODUODENOSCOPY (EGD) WITH PROPOFOL;  Surgeon: Toledo, Boykin Nearing, MD;  Location: [facility];  Service: Gastroenterology;  Laterality: N/A;   NO PAST SURGERIES     OTHER SURGICAL HISTORY     Hernia, 4-5 yrs old    Social History   Socioeconomic History   Marital status: Single    Spouse name: Not on file   Number of children: Not on file   Years of education: Not on file   Highest education level: Not on file  Occupational History   Not on file  Tobacco Use   Smoking status: Every Day    Current packs/day: 0.50    Types: Cigarettes   Smokeless tobacco: Never  Vaping Use   Vaping status: Never Used  Substance and Sexual Activity   Alcohol use: Yes     Alcohol/week: 0.0 standard drinks of alcohol    Comment: socially   Drug use: No   Sexual activity: Yes    Birth control/protection: None  Other Topics Concern   Not on file  Social History Narrative   Not on file   Social Determinants of Health   Financial Resource Strain: Not on file  Food Insecurity: Not on file  Transportation Needs: Not on file  Physical Activity: Not on file  Stress: Not on file  Social Connections: Not on file  Intimate Partner Violence: Not on file    Family History  Problem Relation Age of Onset   Hypertension Mother    Healthy Father     No Known Allergies  Review of Systems  Genitourinary:  Positive for dysuria and frequency.  All other systems reviewed and are negative.      Objective:   BP 100/70   Pulse 78   Ht 5\' 4"  (1.626 m)   Wt 124 lb 9.6 oz (56.5 kg)   SpO2 98%   BMI 21.39 kg/m   Vitals:   09/27/22 1531  BP: 100/70  Pulse: 78  Height: 5\' 4"  (1.626 m)  Weight: 124 lb 9.6 oz (56.5 kg)  SpO2: 98%  BMI (Calculated): 21.38    Physical Exam Vitals and nursing note reviewed.  Constitutional:      General: She  is awake.     Appearance: Normal appearance. She is well-developed, well-groomed and normal weight. She is not ill-appearing, toxic-appearing or diaphoretic.  HENT:     Head: Normocephalic.  Eyes:     Pupils: Pupils are equal, round, and reactive to light.  Cardiovascular:     Rate and Rhythm: Normal rate.     Pulses: Normal pulses.  Pulmonary:     Effort: Pulmonary effort is normal.  Neurological:     General: No focal deficit present.     Mental Status: She is alert and oriented to person, place, and time. Mental status is at baseline.  Psychiatric:        Attention and Perception: Attention normal.        Mood and Affect: Mood normal. Affect is blunt and flat.        Speech: Speech normal.        Behavior: Behavior normal. Behavior is cooperative.        Thought Content: Thought content normal.         Cognition and Memory: Cognition and memory normal.        Judgment: Judgment normal.     Results for orders placed or performed in visit on 09/27/22  Lipid panel  Result Value Ref Range   Cholesterol, Total 160 100 - 199 mg/dL   Triglycerides 82 0 - 149 mg/dL   HDL 68 >40 mg/dL   VLDL Cholesterol Cal 15 5 - 40 mg/dL   LDL Chol Calc (NIH) 77 0 - 99 mg/dL   Chol/HDL Ratio 2.4 0.0 - 4.4 ratio  VITAMIN D 25 Hydroxy (Vit-D Deficiency, Fractures)  Result Value Ref Range   Vit D, 25-Hydroxy 16.3 (L) 30.0 - 100.0 ng/mL  CBC With Differential  Result Value Ref Range   WBC 5.3 3.4 - 10.8 x10E3/uL   RBC 4.48 3.77 - 5.28 x10E6/uL   Hemoglobin 13.2 11.1 - 15.9 g/dL   Hematocrit 98.1 19.1 - 46.6 %   MCV 93 79 - 97 fL   MCH 29.5 26.6 - 33.0 pg   MCHC 31.7 31.5 - 35.7 g/dL   RDW 47.8 29.5 - 62.1 %   Neutrophils 61 Not Estab. %   Lymphs 29 Not Estab. %   Monocytes 7 Not Estab. %   Eos 3 Not Estab. %   Basos 0 Not Estab. %   Neutrophils Absolute 3.2 1.4 - 7.0 x10E3/uL   Lymphocytes Absolute 1.5 0.7 - 3.1 x10E3/uL   Monocytes Absolute 0.4 0.1 - 0.9 x10E3/uL   EOS (ABSOLUTE) 0.2 0.0 - 0.4 x10E3/uL   Basophils Absolute 0.0 0.0 - 0.2 x10E3/uL   Immature Granulocytes 0 Not Estab. %   Immature Grans (Abs) 0.0 0.0 - 0.1 x10E3/uL  CMP14+EGFR  Result Value Ref Range   Glucose 69 (L) 70 - 99 mg/dL   BUN 10 6 - 20 mg/dL   Creatinine, Ser 3.08 0.57 - 1.00 mg/dL   eGFR 657 >84 ON/GEX/5.28   BUN/Creatinine Ratio 14 9 - 23   Sodium 139 134 - 144 mmol/L   Potassium 4.3 3.5 - 5.2 mmol/L   Chloride 102 96 - 106 mmol/L   CO2 24 20 - 29 mmol/L   Calcium 9.6 8.7 - 10.2 mg/dL   Total Protein 7.0 6.0 - 8.5 g/dL   Albumin 4.4 3.9 - 4.9 g/dL   Globulin, Total 2.6 1.5 - 4.5 g/dL   Bilirubin Total 0.5 0.0 - 1.2 mg/dL   Alkaline Phosphatase 73 44 - 121 IU/L  AST 18 0 - 40 IU/L   ALT 10 0 - 32 IU/L  TSH  Result Value Ref Range   TSH 2.430 0.450 - 4.500 uIU/mL  Hemoglobin A1c  Result Value Ref Range    Hgb A1c MFr Bld 5.1 4.8 - 5.6 %   Est. average glucose Bld gHb Est-mCnc 100 mg/dL  Vitamin W09  Result Value Ref Range   Vitamin B-12 496 232 - 1,245 pg/mL  POCT Urinalysis Dipstick (81002)  Result Value Ref Range   Color, UA     Clarity, UA     Glucose, UA Negative Negative   Bilirubin, UA negative    Ketones, UA negative    Spec Grav, UA 1.015 1.010 - 1.025   Blood, UA negative    pH, UA 6.0 5.0 - 8.0   Protein, UA Negative Negative   Urobilinogen, UA 0.2 0.2 or 1.0 E.U./dL   Nitrite, UA negative    Leukocytes, UA Negative Negative   Appearance     Odor      Recent Results (from the past 2160 hour(s))  Urinalysis, Routine w reflex microscopic -Urine, Clean Catch     Status: None   Collection Time: 08/21/22  3:15 PM  Result Value Ref Range   Color, Urine YELLOW YELLOW   APPearance CLEAR CLEAR   Specific Gravity, Urine 1.020 1.005 - 1.030   pH 7.5 5.0 - 8.0   Glucose, UA NEGATIVE NEGATIVE mg/dL   Hgb urine dipstick NEGATIVE NEGATIVE   Bilirubin Urine NEGATIVE NEGATIVE   Ketones, ur NEGATIVE NEGATIVE mg/dL   Protein, ur NEGATIVE NEGATIVE mg/dL   Nitrite NEGATIVE NEGATIVE   Leukocytes,Ua NEGATIVE NEGATIVE    Comment: Microscopic not done on urines with negative protein, blood, leukocytes, nitrite, or glucose < 500 mg/dL. Performed at [facility], [address], [place], [place] [address]   CBC with Differential     Status: None   Collection Time: 08/21/22  3:15 PM  Result Value Ref Range   WBC 9.5 4.0 - 10.5 K/uL   RBC 4.52 3.87 - 5.11 MIL/uL   Hemoglobin 13.5 12.0 - 15.0 g/dL   HCT 47.8 29.5 - 62.1 %   MCV 86.9 80.0 - 100.0 fL   MCH 29.9 26.0 - 34.0 pg   MCHC 34.4 30.0 - 36.0 g/dL   RDW 30.8 65.7 - 84.6 %   Platelets 284 150 - 400 K/uL   nRBC 0.0 0.0 - 0.2 %   Neutrophils Relative % 74 %   Neutro Abs 7.1 1.7 - 7.7 K/uL   Lymphocytes Relative 17 %   Lymphs Abs 1.6 0.7 - 4.0 K/uL   Monocytes Relative 6 %   Monocytes Absolute 0.5 0.1 - 1.0 K/uL    Eosinophils Relative 2 %   Eosinophils Absolute 0.2 0.0 - 0.5 K/uL   Basophils Relative 1 %   Basophils Absolute 0.1 0.0 - 0.1 K/uL   Immature Granulocytes 0 %   Abs Immature Granulocytes 0.03 0.00 - 0.07 K/uL    Comment: Performed at [facility], [address], [place], [place] [address]  Comprehensive metabolic panel     Status: None   Collection Time: 08/21/22  3:15 PM  Result Value Ref Range   Sodium 135 135 - 145 mmol/L   Potassium 3.6 3.5 - 5.1 mmol/L   Chloride 101 98 - 111 mmol/L   CO2 26 22 - 32 mmol/L   Glucose, Bld 76 70 - 99 mg/dL    Comment:  Glucose reference range applies only to samples taken after fasting for at least 8 hours.   BUN 10 6 - 20 mg/dL   Creatinine, Ser 1.61 0.44 - 1.00 mg/dL   Calcium 8.9 8.9 - 09.6 mg/dL   Total Protein 7.8 6.5 - 8.1 g/dL   Albumin 4.2 3.5 - 5.0 g/dL   AST 20 15 - 41 U/L   ALT 12 0 - 44 U/L   Alkaline Phosphatase 68 38 - 126 U/L   Total Bilirubin 0.4 0.3 - 1.2 mg/dL   GFR, Estimated >04 >54 mL/min    Comment: (NOTE) Calculated using the CKD-EPI Creatinine Equation (2021)    Anion gap 8 5 - 15    Comment: Performed at [facility], [address] [place], [place] [address]  Lipase, blood     Status: None   Collection Time: 08/21/22  3:15 PM  Result Value Ref Range   Lipase 30 11 - 51 U/L    Comment: Performed at [facility], [address], [place], [place] [address]  POCT Urinalysis Dipstick [phone])     Status: None   Collection Time: 09/27/22  3:45 PM  Result Value Ref Range   Color, UA     Clarity, UA     Glucose, UA Negative Negative   Bilirubin, UA negative    Ketones, UA negative    Spec Grav, UA 1.015 1.010 - 1.025   Blood, UA negative    pH, UA 6.0 5.0 - 8.0   Protein, UA Negative Negative   Urobilinogen, UA 0.2 0.2 or 1.0 E.U./dL   Nitrite, UA negative    Leukocytes, UA Negative Negative   Appearance     Odor    Lipid panel     Status: None   Collection Time:  09/27/22  3:57 PM  Result Value Ref Range   Cholesterol, Total 160 100 - 199 mg/dL   Triglycerides 82 0 - 149 mg/dL   HDL 68 >13 mg/dL   VLDL Cholesterol Cal 15 5 - 40 mg/dL   LDL Chol Calc (NIH) 77 0 - 99 mg/dL   Chol/HDL Ratio 2.4 0.0 - 4.4 ratio    Comment:                                   T. Chol/HDL Ratio                                             Men  Women                               1/2 Avg.Risk  3.4    3.3                                   Avg.Risk  5.0    4.4                                2X Avg.Risk  9.6    7.1  3X Avg.Risk 23.4   11.0   VITAMIN D 25 Hydroxy (Vit-D Deficiency, Fractures)     Status: Abnormal   Collection Time: 09/27/22  3:57 PM  Result Value Ref Range   Vit D, 25-Hydroxy 16.3 (L) 30.0 - 100.0 ng/mL    Comment: Vitamin D deficiency has been defined by the Institute of Medicine and an Endocrine Society practice guideline as a level of serum 25-OH vitamin D less than 20 ng/mL (1,2). The Endocrine Society went on to further define vitamin D insufficiency as a level between 21 and 29 ng/mL (2). 1. IOM (Institute of Medicine). 2010. Dietary reference    intakes for calcium and D. [place]: The    [employer]. 2. Holick MF, [place], Bischoff-Ferrari HA, et al.    Evaluation, treatment, and prevention of vitamin D    deficiency: an Endocrine Society clinical practice    guideline. JCEM. 2011 Jul; 96(7):1911-30.   CBC With Differential     Status: None   Collection Time: 09/27/22  3:57 PM  Result Value Ref Range   WBC 5.3 3.4 - 10.8 x10E3/uL   RBC 4.48 3.77 - 5.28 x10E6/uL   Hemoglobin 13.2 11.1 - 15.9 g/dL   Hematocrit 16.1 09.6 - 46.6 %   MCV 93 79 - 97 fL   MCH 29.5 26.6 - 33.0 pg   MCHC 31.7 31.5 - 35.7 g/dL   RDW 04.5 40.9 - 81.1 %   Neutrophils 61 Not Estab. %   Lymphs 29 Not Estab. %   Monocytes 7 Not Estab. %   Eos 3 Not Estab. %   Basos 0 Not Estab. %   Neutrophils Absolute 3.2 1.4 -  7.0 x10E3/uL   Lymphocytes Absolute 1.5 0.7 - 3.1 x10E3/uL   Monocytes Absolute 0.4 0.1 - 0.9 x10E3/uL   EOS (ABSOLUTE) 0.2 0.0 - 0.4 x10E3/uL   Basophils Absolute 0.0 0.0 - 0.2 x10E3/uL   Immature Granulocytes 0 Not Estab. %   Immature Grans (Abs) 0.0 0.0 - 0.1 x10E3/uL    Comment: **Effective September 30, 2022, profile [id number] CBC/Differential**   (No Platelet) will be made non-orderable. [facility] Offers:   [id number] CBC With Differential/Platelet   CMP14+EGFR     Status: Abnormal   Collection Time: 09/27/22  3:57 PM  Result Value Ref Range   Glucose 69 (L) 70 - 99 mg/dL   BUN 10 6 - 20 mg/dL   Creatinine, Ser 9.56 0.57 - 1.00 mg/dL   eGFR 213 >08 MV/HQI/6.96   BUN/Creatinine Ratio 14 9 - 23   Sodium 139 134 - 144 mmol/L   Potassium 4.3 3.5 - 5.2 mmol/L   Chloride 102 96 - 106 mmol/L   CO2 24 20 - 29 mmol/L   Calcium 9.6 8.7 - 10.2 mg/dL   Total Protein 7.0 6.0 - 8.5 g/dL   Albumin 4.4 3.9 - 4.9 g/dL   Globulin, Total 2.6 1.5 - 4.5 g/dL   Bilirubin Total 0.5 0.0 - 1.2 mg/dL   Alkaline Phosphatase 73 44 - 121 IU/L   AST 18 0 - 40 IU/L   ALT 10 0 - 32 IU/L  TSH     Status: None   Collection Time: 09/27/22  3:57 PM  Result Value Ref Range   TSH 2.430 0.450 - 4.500 uIU/mL  Hemoglobin A1c     Status: None   Collection Time: 09/27/22  3:57 PM  Result Value Ref Range   Hgb A1c MFr Bld 5.1 4.8 - 5.6 %  Comment:          Prediabetes: 5.7 - 6.4          Diabetes: >6.4          Glycemic control for adults with diabetes: <7.0    Est. average glucose Bld gHb Est-mCnc 100 mg/dL  Vitamin Z61     Status: None   Collection Time: 09/27/22  3:57 PM  Result Value Ref Range   Vitamin B-12 496 232 - 1,245 pg/mL       Assessment & Plan:   Problem List Items Addressed This Visit       Active Problems   Mixed hyperlipidemia    Checking labs today.  Continue current therapy for lipid control. Will modify as needed based on labwork results.       Relevant Orders   Lipid panel  (Completed)   CBC With Differential (Completed)   CMP14+EGFR (Completed)   TSH (Completed)   Vitamin D deficiency, unspecified    Checking labs today.  Will continue supplements as needed.       Relevant Orders   VITAMIN D 25 Hydroxy (Vit-D Deficiency, Fractures) (Completed)   CBC With Differential (Completed)   CMP14+EGFR (Completed)   TSH (Completed)   Other Visit Diagnoses     Dysuria    -  Primary   UA in office today WNL.  Encouraged increase in water intake.  Patient verbalized understanding.   Relevant Orders   POCT Urinalysis Dipstick (81002) (Completed)   CBC With Differential (Completed)   CMP14+EGFR (Completed)   TSH (Completed)   Prediabetes       Checking A1C today. Will adjust as needed based on that result.  Reassess at follow up.   Relevant Orders   CBC With Differential (Completed)   CMP14+EGFR (Completed)   TSH (Completed)   Hemoglobin A1c (Completed)   B12 deficiency due to diet       Checking labs today.  Will continue supplements as needed.   Relevant Orders   CBC With Differential (Completed)   CMP14+EGFR (Completed)   TSH (Completed)   Vitamin B12 (Completed)   Other fatigue       Checking labs today.  Will continue supplements as needed.   Relevant Orders   CBC With Differential (Completed)   CMP14+EGFR (Completed)   TSH (Completed)       Return in about 3 months (around 12/28/2022) for F/U.   Total time spent: 20 minutes  Miki Kins, FNP  09/27/2022   This document may have been prepared by [facility] Voice Recognition software and as such may include unintentional dictation errors.

## 2022-10-17 NOTE — Assessment & Plan Note (Signed)
Checking labs today.  Will continue supplements as needed.  

## 2022-10-17 NOTE — Assessment & Plan Note (Signed)
Checking labs today.  Continue current therapy for lipid control. Will modify as needed based on labwork results.  

## 2022-10-23 DIAGNOSIS — F411 Generalized anxiety disorder: Secondary | ICD-10-CM | POA: Diagnosis not present

## 2022-10-23 DIAGNOSIS — F331 Major depressive disorder, recurrent, moderate: Secondary | ICD-10-CM | POA: Diagnosis not present

## 2022-10-23 DIAGNOSIS — F1410 Cocaine abuse, uncomplicated: Secondary | ICD-10-CM | POA: Diagnosis not present

## 2022-10-24 DIAGNOSIS — F331 Major depressive disorder, recurrent, moderate: Secondary | ICD-10-CM | POA: Diagnosis not present

## 2022-10-24 DIAGNOSIS — F411 Generalized anxiety disorder: Secondary | ICD-10-CM | POA: Diagnosis not present

## 2022-10-24 DIAGNOSIS — F1410 Cocaine abuse, uncomplicated: Secondary | ICD-10-CM | POA: Diagnosis not present

## 2022-10-25 ENCOUNTER — Encounter: Payer: Self-pay | Admitting: Emergency Medicine

## 2022-10-25 ENCOUNTER — Ambulatory Visit
Admission: EM | Admit: 2022-10-25 | Discharge: 2022-10-25 | Disposition: A | Discharge status: Home / Self Care | Payer: 59 | Attending: Urgent Care | Admitting: Urgent Care

## 2022-10-25 DIAGNOSIS — B029 Zoster without complications: Secondary | ICD-10-CM | POA: Diagnosis not present

## 2022-10-25 MED ORDER — VALACYCLOVIR HCL 1 G PO TABS: 1000.0000 mg | ORAL_TABLET | Freq: Three times a day (TID) | ORAL | 0 refills | Status: DC

## 2022-10-25 MED ORDER — TRIAMCINOLONE ACETONIDE 0.1 % EX CREA: TOPICAL_CREAM | CUTANEOUS | 0 refills | Status: DC

## 2022-10-25 NOTE — ED Triage Notes (Signed)
Patient red itching and painful rash on the left side of her mid back since yesterday.

## 2022-10-25 NOTE — Discharge Instructions (Signed)
Follow up here or with your primary care provider if your symptoms are worsening or not improving with treatment.     

## 2022-10-25 NOTE — ED Provider Notes (Signed)
[facility]    CSN: [id number] Arrival date & time: 10/25/22  1522      History   Chief Complaint Chief Complaint  Patient presents with   Rash    HPI NUPUR DEAS is a 32 y.o. female.    Rash  Presents to urgent care with complaint of redness, itching, painful rash on the left side of her mid back since yesterday.  Past Medical History:  Diagnosis Date   Anxiety    No known health problems     Patient Active Problem List   Diagnosis Date Noted   Mixed hyperlipidemia 07/22/2017   Vitamin D deficiency, unspecified 07/22/2017    Past Surgical History:  Procedure Laterality Date   ESOPHAGOGASTRODUODENOSCOPY (EGD) WITH PROPOFOL N/A 09/16/2018   Procedure: ESOPHAGOGASTRODUODENOSCOPY (EGD) WITH PROPOFOL;  Surgeon: Toledo, Boykin Nearing, MD;  Location: [facility] END[facility];  Service: Gastroenterology;  Laterality: N/A;   NO PAST SURGERIES     OTHER SURGICAL HISTORY     Hernia, 4-5 yrs old    OB History     Gravida  0   Para  0   Term  0   Preterm  0   AB  0   Living  0      SAB  0   IAB  0   Ectopic  0   Multiple  0   Live Births  0            Home Medications    Prior to Admission medications   Medication Sig Start Date End Date Taking? Authorizing Provider  ondansetron (ZOFRAN-ODT) 4 MG disintegrating tablet Take 1 tablet (4 mg total) by mouth every 8 (eight) hours as needed. Patient not taking: Reported on 09/27/2022 08/21/22   Katha Cabal, DO  Vilazodone HCl (VIIBRYD) 10 MG TABS Take 1 tablet (10 mg total) by mouth daily. 09/27/22   Miki Kins, FNP  Vitamin D, Ergocalciferol, (DRISDOL) 1.25 MG (50000 UNIT) CAPS capsule Take 1 capsule (50,000 Units total) by mouth every 7 (seven) days. 10/11/22   Miki Kins, FNP  Vitamin D, Ergocalciferol, (DRISDOL) 1.25 MG (50000 UNIT) CAPS capsule Take 1 capsule by mouth once a week. 03/05/18   [provider]  famotidine (PEPCID) 20 MG tablet Take 1 tablet (20 mg total) by  mouth 2 (two) times daily for 15 days. 02/22/19 03/07/20  Dionne Bucy, MD  fluticasone (FLONASE) 50 MCG/ACT nasal spray Place 2 sprays into both nostrils daily. 02/21/18 11/22/19  Menshew, Charlesetta Ivory, PA-C  loratadine (CLARITIN) 10 MG tablet TK 1 T PO QAM 05/26/18 11/22/19  [provider]    Family History Family History  Problem Relation Age of Onset   Hypertension Mother    Healthy Father     Social History Social History   Tobacco Use   Smoking status: Every Day    Current packs/day: 0.50    Types: Cigarettes   Smokeless tobacco: Never  Vaping Use   Vaping status: Never Used  Substance Use Topics   Alcohol use: Yes    Alcohol/week: 0.0 standard drinks of alcohol    Comment: socially   Drug use: No     Allergies   Patient has no known allergies.   Review of Systems Review of Systems  Skin:  Positive for rash.     Physical Exam Triage Vital Signs ED Triage Vitals  Encounter Vitals Group     BP 10/25/22 1530 118/83     Systolic BP Percentile --  Diastolic BP Percentile --      Pulse Rate 10/25/22 1530 100     Resp 10/25/22 1530 14     Temp 10/25/22 1530 97.7 F (36.5 C)     Temp Source 10/25/22 1530 Oral     SpO2 10/25/22 1530 98 %     Weight 10/25/22 1529 125 lb (56.7 kg)     Height 10/25/22 1529 5\' 4"  (1.626 m)     Head Circumference --      Peak Flow --      Pain Score 10/25/22 1529 9     Pain Loc --      Pain Education --      Exclude from Growth Chart --    No data found.  Updated Vital Signs BP 118/83 (BP Location: Right Arm)   Pulse 100   Temp 97.7 F (36.5 C) (Oral)   Resp 14   Ht 5\' 4"  (1.626 m)   Wt 125 lb (56.7 kg)   LMP 09/24/2022 (Approximate)   SpO2 98%   BMI 21.46 kg/m   Visual Acuity Right Eye Distance:   Left Eye Distance:   Bilateral Distance:    Right Eye Near:   Left Eye Near:    Bilateral Near:     Physical Exam Vitals reviewed.  Constitutional:      Appearance: Normal appearance.   Skin:    General: Skin is warm and dry.     Findings: Erythema and rash present. Rash is vesicular.       Neurological:     General: No focal deficit present.     Mental Status: She is alert and oriented to person, place, and time.  Psychiatric:        Mood and Affect: Mood normal.        Behavior: Behavior normal.      UC Treatments / Results  Labs (all labs ordered are listed, but only abnormal results are displayed) Labs Reviewed - No data to display  EKG   Radiology No results found.  Procedures Procedures (including critical care time)  Medications Ordered in UC Medications - No data to display  Initial Impression / Assessment and Plan / UC Course  I have reviewed the triage vital signs and the nursing notes.  Pertinent labs & imaging results that were available during my care of the patient were reviewed by me and considered in my medical decision making (see chart for details).   BELEN WESTMEYER is a 32 y.o. female presenting with rash. Patient is afebrile without recent antipyretics, satting well on room air. Overall is well appearing, well hydrated, without respiratory distress. Erythematous rash with some vesicles present left side of mid back.  Rash appears to be limited to the T5 dermatome.  Tender to light palpation.  Reviewed relevant chart history.   Suspect shingles outbreak.  Patient's mom on phone and does not think she had chickenpox as a child.  Not aware if she had chickenpox vaccination.  Will treat with valacyclovir. Ordering triamcinolone cream for topical use.  Counseled patient on potential for adverse effects with medications prescribed/recommended today, ER and return-to-clinic precautions discussed, patient verbalized understanding and agreement with care plan.  Final Clinical Impressions(s) / UC Diagnoses   Final diagnoses:  None   Discharge Instructions   None    ED Prescriptions   None    PDMP not reviewed this encounter.    Charma Igo, [place] 10/25/22 [phone]

## 2022-10-29 DIAGNOSIS — F411 Generalized anxiety disorder: Secondary | ICD-10-CM | POA: Diagnosis not present

## 2022-10-29 DIAGNOSIS — F331 Major depressive disorder, recurrent, moderate: Secondary | ICD-10-CM | POA: Diagnosis not present

## 2022-10-29 DIAGNOSIS — F1410 Cocaine abuse, uncomplicated: Secondary | ICD-10-CM | POA: Diagnosis not present

## 2022-11-17 ENCOUNTER — Other Ambulatory Visit: Payer: Self-pay | Admitting: Family

## 2023-03-20 ENCOUNTER — Ambulatory Visit
Admission: EM | Admit: 2023-03-20 | Discharge: 2023-03-20 | Disposition: A | Discharge status: Home / Self Care | Payer: 59 | Attending: Emergency Medicine | Admitting: Emergency Medicine

## 2023-03-20 ENCOUNTER — Encounter: Payer: Self-pay | Admitting: Emergency Medicine

## 2023-03-20 DIAGNOSIS — M940 Chondrocostal junction syndrome [Tietze]: Secondary | ICD-10-CM | POA: Diagnosis not present

## 2023-03-20 DIAGNOSIS — J069 Acute upper respiratory infection, unspecified: Secondary | ICD-10-CM

## 2023-03-20 DIAGNOSIS — Z01810 Encounter for preprocedural cardiovascular examination: Secondary | ICD-10-CM | POA: Diagnosis not present

## 2023-03-20 MED ORDER — IBUPROFEN 600 MG PO TABS: 600.0000 mg | ORAL_TABLET | Freq: Four times a day (QID) | ORAL | 0 refills | Status: DC | PRN

## 2023-03-20 MED ORDER — BACLOFEN 10 MG PO TABS: 10.0000 mg | ORAL_TABLET | Freq: Three times a day (TID) | ORAL | 0 refills | Status: DC

## 2023-03-20 NOTE — ED Triage Notes (Signed)
Pt presents with central chest pain and headache that started this morning. Pt denies any other symptoms.

## 2023-03-20 NOTE — Discharge Instructions (Addendum)
Take the ibuprofen 600 mg every 6 hours with food to decrease inflammation and aid in pain relief.  Take the baclofen 10 mg every 8 hours to help alleviate muscle tension and also aid in pain relief.  You may apply moist heat or ice to your anterior chest wall for 20 minutes at a time 2-3 times a day as needed for pain.  Rest is much as possible and avoid any vigorous activity that taxes your chest wall and causes increase in pain.  You may also apply topical lidocaine patches to your chest wall.  These are available over-the-counter and are good for 8 hours each.  The ibuprofen will also help you with your headache as a result of your upper respiratory infection.  If your symptoms do not improve, or they worsen, you may possibly need injections in your sternal joints to help alleviate the pain.  I would follow-up with orthopedics, such as [facility] if your symptoms do not improve.

## 2023-03-20 NOTE — ED Provider Notes (Signed)
[facility]    CSN: [id number] Arrival date & time: 03/20/23  1631      History   Chief Complaint Chief Complaint  Patient presents with   Chest Pain   Headache    HPI Breanna Lawrence is a 33 y.o. female.   HPI  33 year old female with past medical history significant for vitamin D deficiency and mixed hyperlipidemia presents for evaluation of central chest pain and frontal headache that started this morning.  The patient is experiencing some nasal congestion and some sinus fullness with clear rhinorrhea.  She denies any cough or shortness of breath.  She also denies any heavy lifting or chest injury.  The pain does worsen with deep breathing and movement.  Past Medical History:  Diagnosis Date   Anxiety    No known health problems     Patient Active Problem List   Diagnosis Date Noted   Mixed hyperlipidemia 07/22/2017   Vitamin D deficiency, unspecified 07/22/2017    Past Surgical History:  Procedure Laterality Date   ESOPHAGOGASTRODUODENOSCOPY (EGD) WITH PROPOFOL N/A 09/16/2018   Procedure: ESOPHAGOGASTRODUODENOSCOPY (EGD) WITH PROPOFOL;  Surgeon: Toledo, Boykin Nearing, MD;  Location: [facility] ENDOSCOPY;  Service: Gastroenterology;  Laterality: N/A;   NO PAST SURGERIES     OTHER SURGICAL HISTORY     Hernia, 4-5 yrs old    OB History     Gravida  0   Para  0   Term  0   Preterm  0   AB  0   Living  0      SAB  0   IAB  0   Ectopic  0   Multiple  0   Live Births  0            Home Medications    Prior to Admission medications   Medication Sig Start Date End Date Taking? Authorizing Provider  baclofen (LIORESAL) 10 MG tablet Take 1 tablet (10 mg total) by mouth 3 (three) times daily. 03/20/23  Yes Becky Augusta, NP  ibuprofen (ADVIL) 600 MG tablet Take 1 tablet (600 mg total) by mouth every 6 (six) hours as needed. 03/20/23  Yes Becky Augusta, NP  ondansetron (ZOFRAN-ODT) 4 MG disintegrating tablet Take 1 tablet (4 mg total) by mouth  every 8 (eight) hours as needed. Patient not taking: Reported on 09/27/2022 08/21/22   Katha Cabal, DO  triamcinolone cream (KENALOG) 0.1 % Apply a small amount to the affected area of your skin.  Rub in completely. 10/25/22   Immordino, Jeannett Senior, FNP  valACYclovir (VALTREX) 1000 MG tablet Take 1 tablet (1,000 mg total) by mouth 3 (three) times daily. 10/25/22   Immordino, Jeannett Senior, FNP  Vilazodone HCl (VIIBRYD) 10 MG TABS TAKE 1 TABLET BY MOUTH EVERY DAY 11/18/22   Miki Kins, FNP  Vitamin D, Ergocalciferol, (DRISDOL) 1.25 MG (50000 UNIT) CAPS capsule Take 1 capsule (50,000 Units total) by mouth every 7 (seven) days. 10/11/22   Miki Kins, FNP  Vitamin D, Ergocalciferol, (DRISDOL) 1.25 MG (50000 UNIT) CAPS capsule Take 1 capsule by mouth once a week. 03/05/18   [provider]  famotidine (PEPCID) 20 MG tablet Take 1 tablet (20 mg total) by mouth 2 (two) times daily for 15 days. 02/22/19 03/07/20  Dionne Bucy, MD  fluticasone (FLONASE) 50 MCG/ACT nasal spray Place 2 sprays into both nostrils daily. 02/21/18 11/22/19  Menshew, Charlesetta Ivory, PA-C  loratadine (CLARITIN) 10 MG tablet TK 1 T PO QAM 05/26/18 11/22/19  [provider]    Family History Family History  Problem Relation Age of Onset   Hypertension Mother    Healthy Father     Social History Social History   Tobacco Use   Smoking status: Every Day    Current packs/day: 0.50    Types: Cigarettes   Smokeless tobacco: Never  Vaping Use   Vaping status: Never Used  Substance Use Topics   Alcohol use: Yes    Alcohol/week: 0.0 standard drinks of alcohol    Comment: socially   Drug use: No     Allergies   Patient has no known allergies.   Review of Systems Review of Systems  Constitutional:  Negative for fever.  HENT:  Positive for congestion and rhinorrhea.   Respiratory:  Negative for cough, shortness of breath and wheezing.   Cardiovascular:  Positive for chest pain.     Physical  Exam Triage Vital Signs ED Triage Vitals  Encounter Vitals Group     BP      Systolic BP Percentile      Diastolic BP Percentile      Pulse      Resp      Temp      Temp src      SpO2      Weight      Height      Head Circumference      Peak Flow      Pain Score      Pain Loc      Pain Education      Exclude from Growth Chart    No data found.  Updated Vital Signs BP 108/84 (BP Location: Left Arm)   Pulse (!) 108   Temp (!) 97.3 F (36.3 C) (Oral)   Resp 18   LMP 02/16/2023   SpO2 97%   Visual Acuity Right Eye Distance:   Left Eye Distance:   Bilateral Distance:    Right Eye Near:   Left Eye Near:    Bilateral Near:     Physical Exam Vitals and nursing note reviewed.  Constitutional:      Appearance: Normal appearance. She is not ill-appearing.  HENT:     Head: Normocephalic and atraumatic.     Nose: Congestion and rhinorrhea present.     Comments: Nasal mucosa is edematous with clear discharge in both nares.  Patient has tenderness to compression of bilateral frontal and maxillary sinuses. Cardiovascular:     Rate and Rhythm: Normal rate and regular rhythm.     Pulses: Normal pulses.     Heart sounds: Normal heart sounds. No murmur heard.    No friction rub. No gallop.  Pulmonary:     Effort: Pulmonary effort is normal.     Breath sounds: Normal breath sounds. No wheezing, rhonchi or rales.     Comments: Tenderness with palpation of the costal cartilage on both sides. Chest:     Chest wall: Tenderness present.  Skin:    General: Skin is warm and dry.     Capillary Refill: Capillary refill takes less than 2 seconds.     Findings: No rash.  Neurological:     General: No focal deficit present.     Mental Status: She is alert and oriented to person, place, and time.      UC Treatments / Results  Labs (all labs ordered are listed, but only abnormal results are displayed) Labs Reviewed - No data to display  EKG Normal sinus  rhythm with  ventricular to 67 bpm PR interval 160 ms QRS duration 70 ms QT/QTc 400/422 ms No ST or T wave abnormalities noted.  Radiology No results found.  Procedures Procedures (including critical care time)  Medications Ordered in UC Medications - No data to display  Initial Impression / Assessment and Plan / UC Course  I have reviewed the triage vital signs and the nursing notes.  Pertinent labs & imaging results that were available during my care of the patient were reviewed by me and considered in my medical decision making (see chart for details).   Patient is a nontoxic-appearing 33 year old female presenting for evaluation of frontal headache and central chest pain as outlined HPI above.  The patient has nasal congestion and visual inspection of nasal passages and reveals inflamed mucosa with clear rhinorrhea.  She also is tenderness to compression of bilateral frontal and maxillary sinuses.  With regards to the patient's cardiopulmonary exam she has S1-S2 heart sounds with regular rate and rhythm and lung sounds that are clear to auscultation in all fields.  Her chest pain is reproducible with palpation of the costal cartilage on both sides of her sternum.  I suspect that the patient's headache is related to sinus inflammation due to an upper respiratory infection.  The central chest pain is due to costochondritis.  EKG was collected at triage which shows normal sinus rhythm without any ST or T wave abnormalities.  I will discharge patient on the diagnosis of URI and also costochondritis and treat her with ibuprofen and baclofen.  She can also apply topical Salonpas patches to her chest every 8 hours as needed for pain and inflammation.   Final Clinical Impressions(s) / UC Diagnoses   Final diagnoses:  Acute URI  Costochondritis     Discharge Instructions      Take the ibuprofen 600 mg every 6 hours with food to decrease inflammation and aid in pain relief.  Take the baclofen 10 mg  every 8 hours to help alleviate muscle tension and also aid in pain relief.  You may apply moist heat or ice to your anterior chest wall for 20 minutes at a time 2-3 times a day as needed for pain.  Rest is much as possible and avoid any vigorous activity that taxes your chest wall and causes increase in pain.  You may also apply topical lidocaine patches to your chest wall.  These are available over-the-counter and are good for 8 hours each.  The ibuprofen will also help you with your headache as a result of your upper respiratory infection.  If your symptoms do not improve, or they worsen, you may possibly need injections in your sternal joints to help alleviate the pain.  I would follow-up with orthopedics, such as [facility] if your symptoms do not improve.      ED Prescriptions     Medication Sig Dispense Auth. Provider   ibuprofen (ADVIL) 600 MG tablet Take 1 tablet (600 mg total) by mouth every 6 (six) hours as needed. 30 tablet Becky Augusta, NP   baclofen (LIORESAL) 10 MG tablet Take 1 tablet (10 mg total) by mouth 3 (three) times daily. 30 each Becky Augusta, NP      PDMP not reviewed this encounter.   Becky Augusta, NP 03/20/23 [phone]

## 2023-03-21 ENCOUNTER — Other Ambulatory Visit: Payer: Self-pay

## 2023-03-21 ENCOUNTER — Emergency Department: Payer: 59

## 2023-03-21 DIAGNOSIS — Z5321 Procedure and treatment not carried out due to patient leaving prior to being seen by health care provider: Secondary | ICD-10-CM | POA: Insufficient documentation

## 2023-03-21 DIAGNOSIS — F17210 Nicotine dependence, cigarettes, uncomplicated: Secondary | ICD-10-CM | POA: Insufficient documentation

## 2023-03-21 DIAGNOSIS — R0789 Other chest pain: Secondary | ICD-10-CM | POA: Diagnosis not present

## 2023-03-21 DIAGNOSIS — R079 Chest pain, unspecified: Secondary | ICD-10-CM | POA: Diagnosis not present

## 2023-03-21 LAB — BASIC METABOLIC PANEL
Anion gap: 10 (ref 5–15)
BUN: 13 mg/dL (ref 6–20)
CO2: 24 mmol/L (ref 22–32)
Calcium: 9 mg/dL (ref 8.9–10.3)
Chloride: 104 mmol/L (ref 98–111)
Creatinine, Ser: 0.93 mg/dL (ref 0.44–1.00)
GFR, Estimated: >60 mL/min (ref >60)
Glucose, Bld: 92 mg/dL (ref 70–99)
Potassium: 4.1 mmol/L (ref 3.5–5.1)
Sodium: 138 mmol/L (ref 135–145)

## 2023-03-21 LAB — CBC
HCT: 35 % — ABNORMAL LOW (ref 36.0–46.0)
Hemoglobin: 12 g/dL (ref 12.0–15.0)
MCH: 29.9 pg (ref 26.0–34.0)
MCHC: 34.3 g/dL (ref 30.0–36.0)
MCV: 87.1 fL (ref 80.0–100.0)
Platelets: 297 10*3/uL (ref 150–400)
RBC: 4.02 MIL/uL (ref 3.87–5.11)
RDW: 13.5 % (ref 11.5–15.5)
WBC: 5.8 10*3/uL (ref 4.0–10.5)
nRBC: 0 % (ref 0.0–0.2)

## 2023-03-21 LAB — TROPONIN I (HIGH SENSITIVITY): Troponin I (High Sensitivity): <2 ng/L (ref <18)

## 2023-03-21 NOTE — ED Triage Notes (Addendum)
Pt to ed from home via POV fro CP. Pt was seen at [facility] UC yesterday for same and was diagnosed with chest wall pain. Pt is caox4, in no acute distress and ambulatory in triage. Pt does admitt to drinking and using drugs.

## 2023-03-21 NOTE — ED Provider Triage Note (Signed)
Emergency Medicine Provider Triage Evaluation Note  Breanna Lawrence , a 33 y.o. female  was evaluated in triage.  Pt complains of chest pain. She was evaluated at [facility] yesterday and diagnosed with chest wall pain. No improvement. History of anxiety. No cardiac history. Admits to smoking cigarettes and marijuana.  Physical Exam  LMP 02/16/2023  Gen:   Awake, no distress   Resp:  Normal effort  MSK:   Moves extremities without difficulty  Other:    Medical Decision Making  Medically screening exam initiated at 9:31 PM.  Appropriate orders placed.  Breanna Lawrence was informed that the remainder of the evaluation will be completed by another provider, this initial triage assessment does not replace that evaluation, and the importance of remaining in the ED until their evaluation is complete.  Cardiac workup started.   Chinita Pester, FNP 03/21/23 2139

## 2023-03-22 ENCOUNTER — Emergency Department
Admission: EM | Admit: 2023-03-22 | Discharge: 2023-03-22 | Discharge status: Against Medical Advice | Payer: 59 | Attending: Emergency Medicine | Admitting: Emergency Medicine

## 2023-04-14 ENCOUNTER — Ambulatory Visit: Payer: 59 | Admitting: Family

## 2023-07-25 ENCOUNTER — Encounter: Payer: Self-pay | Admitting: Family

## 2023-07-25 ENCOUNTER — Ambulatory Visit (INDEPENDENT_AMBULATORY_CARE_PROVIDER_SITE_OTHER): Admitting: Family

## 2023-07-25 VITALS — BP 94/70 | HR 89 | Ht 64.0 in | Wt 119.6 lb

## 2023-07-25 DIAGNOSIS — R7303 Prediabetes: Secondary | ICD-10-CM

## 2023-07-25 DIAGNOSIS — E782 Mixed hyperlipidemia: Secondary | ICD-10-CM | POA: Diagnosis not present

## 2023-07-25 DIAGNOSIS — R5383 Other fatigue: Secondary | ICD-10-CM

## 2023-07-25 DIAGNOSIS — E559 Vitamin D deficiency, unspecified: Secondary | ICD-10-CM

## 2023-07-25 DIAGNOSIS — Z0130 Encounter for examination of blood pressure without abnormal findings: Secondary | ICD-10-CM

## 2023-07-25 DIAGNOSIS — E538 Deficiency of other specified B group vitamins: Secondary | ICD-10-CM

## 2023-07-25 NOTE — Progress Notes (Signed)
 Established Patient Office Visit  Subjective:  Patient ID: Breanna Lawrence, female    DOB: 05-31-1990  Age: 33 y.o. MRN: [id number]  Chief Complaint  Patient presents with   Follow-up    Patient is here today for her  follow up.  She has been feeling fairly well since last appointment.   She does have additional concerns to discuss today.  She has been having some severe fatigue, asks if we can recheck her labs.   Labs are due today.  She needs refills.   I have reviewed her active problem list, medication list, allergies, health maintenance, notes from last encounter, lab results for her appointment today.      No other concerns at this time.   Past Medical History:  Diagnosis Date   Anxiety    No known health problems     Past Surgical History:  Procedure Laterality Date   ESOPHAGOGASTRODUODENOSCOPY (EGD) WITH PROPOFOL  N/A 09/16/2018   Procedure: ESOPHAGOGASTRODUODENOSCOPY (EGD) WITH PROPOFOL ;  Surgeon: Toledo, Ladell POUR, MD;  Location: [facility] ENDOSCOPY;  Service: Gastroenterology;  Laterality: N/A;   NO PAST SURGERIES     OTHER SURGICAL HISTORY     Hernia, 4-5 yrs old    Social History   Socioeconomic History   Marital status: Single    Spouse name: Not on file   Number of children: Not on file   Years of education: Not on file   Highest education level: Not on file  Occupational History   Not on file  Tobacco Use   Smoking status: Every Day    Current packs/day: 0.50    Types: Cigarettes   Smokeless tobacco: Never  Vaping Use   Vaping status: Never Used  Substance and Sexual Activity   Alcohol use: Yes    Alcohol/week: 0.0 standard drinks of alcohol    Comment: socially   Drug use: No   Sexual activity: Yes    Birth control/protection: None  Other Topics Concern   Not on file  Social History Narrative   Not on file   Social Drivers of Health   Financial Resource Strain: Not on file  Food Insecurity: Not on file  Transportation Needs: Not  on file  Physical Activity: Not on file  Stress: Not on file  Social Connections: Not on file  Intimate Partner Violence: Not on file    Family History  Problem Relation Age of Onset   Hypertension Mother    Healthy Father     No Known Allergies  Review of Systems  Constitutional:  Positive for malaise/fatigue.  All other systems reviewed and are negative.      Objective:   BP 94/70   Pulse 89   Ht 5' 4 (1.626 m)   Wt 119 lb 9.6 oz (54.3 kg)   SpO2 99%   BMI 20.53 kg/m   Vitals:   07/25/23 0952  BP: 94/70  Pulse: 89  Height: 5' 4 (1.626 m)  Weight: 119 lb 9.6 oz (54.3 kg)  SpO2: 99%  BMI (Calculated): 20.52    Physical Exam Vitals and nursing note reviewed.  Constitutional:      Appearance: Normal appearance. She is normal weight.  HENT:     Head: Normocephalic.  Eyes:     Extraocular Movements: Extraocular movements intact.     Conjunctiva/sclera: Conjunctivae normal.     Pupils: Pupils are equal, round, and reactive to light.  Cardiovascular:     Rate and Rhythm: Normal rate.  Pulmonary:  Effort: Pulmonary effort is normal.  Neurological:     General: No focal deficit present.     Mental Status: She is alert and oriented to person, place, and time. Mental status is at baseline.  Psychiatric:        Mood and Affect: Mood normal.        Behavior: Behavior normal.        Thought Content: Thought content normal.        Judgment: Judgment normal.      No results found for any visits on 07/25/23.  No results found for this or any previous visit (from the past 2160 hours).     Assessment & Plan Vitamin D  deficiency, unspecified B12 deficiency due to diet Other fatigue Checking labs today.  Will continue supplements as needed.   - Vitamin D  - Vitamin B12 - TSH  Mixed hyperlipidemia Checking labs today.  Continue current therapy for lipid control. Will modify as needed based on labwork results.   -CMP w/eGFR -Lipid  Panel  Prediabetes A1C Continues to be in prediabetic ranges.  Will reassess at follow up after next lab check.  Patient counseled on dietary choices and verbalized understanding.   -CBC w/Diff -CMP w/eGFR -Hemoglobin A1C     Return in about 1 month (around 08/25/2023) for F/U.   Total time spent: 20 minutes  ALAN CHRISTELLA ARRANT, FNP  07/25/2023   This document may have been prepared by [facility] Voice Recognition software and as such may include unintentional dictation errors.

## 2023-07-26 LAB — CMP14+EGFR
ALT: 9 IU/L (ref 0–32)
AST: 15 IU/L (ref 0–40)
Albumin: 4.4 g/dL (ref 3.9–4.9)
Alkaline Phosphatase: 89 IU/L (ref 44–121)
BUN/Creatinine Ratio: 14 (ref 9–23)
BUN: 11 mg/dL (ref 6–20)
Bilirubin Total: <0.2 mg/dL (ref 0.0–1.2)
CO2: 21 mmol/L (ref 20–29)
Calcium: 9.7 mg/dL (ref 8.7–10.2)
Chloride: 105 mmol/L (ref 96–106)
Creatinine, Ser: 0.78 mg/dL (ref 0.57–1.00)
Globulin, Total: 2.4 g/dL (ref 1.5–4.5)
Glucose: 82 mg/dL (ref 70–99)
Potassium: 4.5 mmol/L (ref 3.5–5.2)
Sodium: 141 mmol/L (ref 134–144)
Total Protein: 6.8 g/dL (ref 6.0–8.5)
eGFR: 103 mL/min/{1.73_m2} (ref >59)

## 2023-07-26 LAB — LIPID PANEL
Chol/HDL Ratio: 2.3 ratio (ref 0.0–4.4)
Cholesterol, Total: 152 mg/dL (ref 100–199)
HDL: 67 mg/dL (ref >39)
LDL Chol Calc (NIH): 74 mg/dL (ref 0–99)
Triglycerides: 54 mg/dL (ref 0–149)
VLDL Cholesterol Cal: 11 mg/dL (ref 5–40)

## 2023-07-26 LAB — IRON,TIBC AND FERRITIN PANEL
Ferritin: 76 ng/mL (ref 15–150)
Iron Saturation: 12 % — ABNORMAL LOW (ref 15–55)
Iron: 44 ug/dL (ref 27–159)
Total Iron Binding Capacity: 355 ug/dL (ref 250–450)
UIBC: 311 ug/dL (ref 131–425)

## 2023-07-26 LAB — CBC WITH DIFFERENTIAL/PLATELET
Basophils Absolute: 0 10*3/uL (ref 0.0–0.2)
Basos: 1 %
EOS (ABSOLUTE): 0.2 10*3/uL (ref 0.0–0.4)
Eos: 4 %
Hematocrit: 38.9 % (ref 34.0–46.6)
Hemoglobin: 12.3 g/dL (ref 11.1–15.9)
Immature Grans (Abs): 0 10*3/uL (ref 0.0–0.1)
Immature Granulocytes: 0 %
Lymphocytes Absolute: 1.4 10*3/uL (ref 0.7–3.1)
Lymphs: 31 %
MCH: 29.8 pg (ref 26.6–33.0)
MCHC: 31.6 g/dL (ref 31.5–35.7)
MCV: 94 fL (ref 79–97)
Monocytes Absolute: 0.3 10*3/uL (ref 0.1–0.9)
Monocytes: 7 %
Neutrophils Absolute: 2.6 10*3/uL (ref 1.4–7.0)
Neutrophils: 57 %
Platelets: 246 10*3/uL (ref 150–450)
RBC: 4.13 x10E6/uL (ref 3.77–5.28)
RDW: 13.4 % (ref 11.7–15.4)
WBC: 4.5 10*3/uL (ref 3.4–10.8)

## 2023-07-26 LAB — VITAMIN D 25 HYDROXY (VIT D DEFICIENCY, FRACTURES): Vit D, 25-Hydroxy: 17.3 ng/mL — ABNORMAL LOW (ref 30.0–100.0)

## 2023-07-26 LAB — TSH: TSH: 1.9 u[IU]/mL (ref 0.450–4.500)

## 2023-07-26 LAB — HEMOGLOBIN A1C
Est. average glucose Bld gHb Est-mCnc: 97 mg/dL
Hgb A1c MFr Bld: 5 % (ref 4.8–5.6)

## 2023-07-26 LAB — VITAMIN B12: Vitamin B-12: 414 pg/mL (ref 232–1245)

## 2023-08-26 ENCOUNTER — Ambulatory Visit: Admitting: Family

## 2023-09-03 ENCOUNTER — Ambulatory Visit: Admitting: Family

## 2023-09-12 ENCOUNTER — Ambulatory Visit (INDEPENDENT_AMBULATORY_CARE_PROVIDER_SITE_OTHER): Admitting: Family

## 2023-09-12 ENCOUNTER — Ambulatory Visit: Payer: Self-pay | Admitting: Family

## 2023-09-12 ENCOUNTER — Encounter: Payer: Self-pay | Admitting: Family

## 2023-09-12 VITALS — BP 96/70 | HR 93 | Ht 64.0 in | Wt 121.0 lb

## 2023-09-12 DIAGNOSIS — Z3200 Encounter for pregnancy test, result unknown: Secondary | ICD-10-CM

## 2023-09-12 DIAGNOSIS — Z3201 Encounter for pregnancy test, result positive: Secondary | ICD-10-CM | POA: Diagnosis not present

## 2023-09-12 DIAGNOSIS — Z0130 Encounter for examination of blood pressure without abnormal findings: Secondary | ICD-10-CM

## 2023-09-12 DIAGNOSIS — N912 Amenorrhea, unspecified: Secondary | ICD-10-CM

## 2023-09-12 LAB — POCT URINE PREGNANCY: Preg Test, Ur: POSITIVE — AB

## 2023-09-12 MED ORDER — PRENATAL VITAMIN PLUS LOW IRON 27-1 MG PO TABS: 1.0000 | ORAL_TABLET | Freq: Every day | ORAL | 3 refills | Status: DC

## 2023-09-12 NOTE — Progress Notes (Signed)
 Established Patient Office Visit  Subjective:  Patient ID: Breanna Lawrence, female    DOB: 1990/09/12  Age: 33 y.o. MRN: [id number]  Chief Complaint  Patient presents with   Follow-up    Discuss medications    Patient is here today with concerns about her recent positive pregnancy test at home. She wants to review her medications to make sure that what ever she is taking is safe for her to take. She already has an appointment set up with OB/GYN.    No other concerns at this time.   Past Medical History:  Diagnosis Date   Anxiety    No known health problems     Past Surgical History:  Procedure Laterality Date   ESOPHAGOGASTRODUODENOSCOPY (EGD) WITH PROPOFOL  N/A 09/16/2018   Procedure: ESOPHAGOGASTRODUODENOSCOPY (EGD) WITH PROPOFOL ;  Surgeon: Toledo, Ladell POUR, MD;  Location: [facility] ENDOSCOPY;  Service: Gastroenterology;  Laterality: N/A;   NO PAST SURGERIES     OTHER SURGICAL HISTORY     Hernia, 4-5 yrs old    Social History   Socioeconomic History   Marital status: Single    Spouse name: Not on file   Number of children: Not on file   Years of education: Not on file   Highest education level: Not on file  Occupational History   Not on file  Tobacco Use   Smoking status: Every Day    Current packs/day: 0.50    Types: Cigarettes   Smokeless tobacco: Never  Vaping Use   Vaping status: Never Used  Substance and Sexual Activity   Alcohol use: Yes    Alcohol/week: 0.0 standard drinks of alcohol    Comment: socially   Drug use: No   Sexual activity: Yes    Birth control/protection: None  Other Topics Concern   Not on file  Social History Narrative   Not on file   Social Drivers of Health   Financial Resource Strain: Not on file  Food Insecurity: Not on file  Transportation Needs: Not on file  Physical Activity: Not on file  Stress: Not on file  Social Connections: Not on file  Intimate Partner Violence: Not on file    Family History  Problem  Relation Age of Onset   Hypertension Mother    Healthy Father     No Known Allergies  Review of Systems  Gastrointestinal:  Positive for nausea and vomiting.  All other systems reviewed and are negative.      Objective:   BP 96/70   Pulse 93   Ht 5' 4 (1.626 m)   Wt 121 lb (54.9 kg)   LMP 06/19/2023 Comment: had cycle in May but isn't sure of dates and says it was not a normal level.  SpO2 99%   BMI 20.77 kg/m   Vitals:   09/12/23 0904  BP: 96/70  Pulse: 93  Height: 5' 4 (1.626 m)  Weight: 121 lb (54.9 kg)  SpO2: 99%  BMI (Calculated): 20.76    Physical Exam Vitals and nursing note reviewed.  Constitutional:      Appearance: Normal appearance. She is normal weight.  HENT:     Head: Normocephalic.  Eyes:     Extraocular Movements: Extraocular movements intact.     Conjunctiva/sclera: Conjunctivae normal.     Pupils: Pupils are equal, round, and reactive to light.  Cardiovascular:     Rate and Rhythm: Normal rate.  Pulmonary:     Effort: Pulmonary effort is normal.  Neurological:  General: No focal deficit present.     Mental Status: She is alert and oriented to person, place, and time. Mental status is at baseline.  Psychiatric:        Mood and Affect: Mood normal.        Behavior: Behavior normal.        Thought Content: Thought content normal.      Results for orders placed or performed in visit on 09/12/23  POCT Urine Pregnancy  Result Value Ref Range   Preg Test, Ur Positive (A) Negative    Recent Results (from the past 2160 hours)  Lipid panel     Status: None   Collection Time: 07/25/23 10:16 AM  Result Value Ref Range   Cholesterol, Total 152 100 - 199 mg/dL   Triglycerides 54 0 - 149 mg/dL   HDL 67 >60 mg/dL   VLDL Cholesterol Cal 11 5 - 40 mg/dL   LDL Chol Calc (NIH) 74 0 - 99 mg/dL   Chol/HDL Ratio 2.3 0.0 - 4.4 ratio    Comment:                                   T. Chol/HDL Ratio                                              Men  Women                               1/2 Avg.Risk  3.4    3.3                                   Avg.Risk  5.0    4.4                                2X Avg.Risk  9.6    7.1                                3X Avg.Risk 23.4   11.0   VITAMIN D  25 Hydroxy (Vit-D Deficiency, Fractures)     Status: Abnormal   Collection Time: 07/25/23 10:16 AM  Result Value Ref Range   Vit D, 25-Hydroxy 17.3 (L) 30.0 - 100.0 ng/mL    Comment: Vitamin D  deficiency has been defined by the Institute of Medicine and an Endocrine Society practice guideline as a level of serum 25-OH vitamin D  less than 20 ng/mL (1,2). The Endocrine Society went on to further define vitamin D  insufficiency as a level between 21 and 29 ng/mL (2). 1. IOM (Institute of Medicine). 2010. Dietary reference    intakes for calcium and D. [place]: The    [employer]. 2. Holick MF, Binkley [place], Bischoff-Ferrari HA, et al.    Evaluation, treatment, and prevention of vitamin D     deficiency: an Endocrine Society clinical practice    guideline. JCEM. 2011 Jul; 96(7):1911-30.   CMP14+EGFR     Status: None   Collection Time: 07/25/23 10:16 AM  Result Value Ref Range   Glucose 82  70 - 99 mg/dL   BUN 11 6 - 20 mg/dL   Creatinine, Ser 9.21 0.57 - 1.00 mg/dL   eGFR 896 >40 fO/fpw/8.26   BUN/Creatinine Ratio 14 9 - 23   Sodium 141 134 - 144 mmol/L   Potassium 4.5 3.5 - 5.2 mmol/L   Chloride 105 96 - 106 mmol/L   CO2 21 20 - 29 mmol/L   Calcium 9.7 8.7 - 10.2 mg/dL   Total Protein 6.8 6.0 - 8.5 g/dL   Albumin 4.4 3.9 - 4.9 g/dL   Globulin, Total 2.4 1.5 - 4.5 g/dL   Bilirubin Total <9.7 0.0 - 1.2 mg/dL   Alkaline Phosphatase 89 44 - 121 IU/L   AST 15 0 - 40 IU/L   ALT 9 0 - 32 IU/L  TSH     Status: None   Collection Time: 07/25/23 10:16 AM  Result Value Ref Range   TSH 1.900 0.450 - 4.500 uIU/mL  Hemoglobin A1c     Status: None   Collection Time: 07/25/23 10:16 AM  Result Value Ref Range   Hgb A1c MFr Bld 5.0 4.8  - 5.6 %    Comment:          Prediabetes: 5.7 - 6.4          Diabetes: >6.4          Glycemic control for adults with diabetes: <7.0    Est. average glucose Bld gHb Est-mCnc 97 mg/dL  Vitamin A87     Status: None   Collection Time: 07/25/23 10:16 AM  Result Value Ref Range   Vitamin B-12 414 232 - 1,245 pg/mL  CBC with Diff     Status: None   Collection Time: 07/25/23 10:16 AM  Result Value Ref Range   WBC 4.5 3.4 - 10.8 x10E3/uL   RBC 4.13 3.77 - 5.28 x10E6/uL   Hemoglobin 12.3 11.1 - 15.9 g/dL   Hematocrit 61.0 65.9 - 46.6 %   MCV 94 79 - 97 fL   MCH 29.8 26.6 - 33.0 pg   MCHC 31.6 31.5 - 35.7 g/dL   RDW 86.5 88.2 - 84.5 %   Platelets 246 150 - 450 x10E3/uL   Neutrophils 57 Not Estab. %   Lymphs 31 Not Estab. %   Monocytes 7 Not Estab. %   Eos 4 Not Estab. %   Basos 1 Not Estab. %   Neutrophils Absolute 2.6 1.4 - 7.0 x10E3/uL   Lymphocytes Absolute 1.4 0.7 - 3.1 x10E3/uL   Monocytes Absolute 0.3 0.1 - 0.9 x10E3/uL   EOS (ABSOLUTE) 0.2 0.0 - 0.4 x10E3/uL   Basophils Absolute 0.0 0.0 - 0.2 x10E3/uL   Immature Granulocytes 0 Not Estab. %   Immature Grans (Abs) 0.0 0.0 - 0.1 x10E3/uL  Iron , TIBC and Ferritin Panel     Status: Abnormal   Collection Time: 07/25/23 10:16 AM  Result Value Ref Range   Total Iron  Binding Capacity 355 250 - 450 ug/dL   UIBC 688 868 - 574 ug/dL   Iron  44 27 - 159 ug/dL   Iron  Saturation 12 (L) 15 - 55 %   Ferritin 76 15 - 150 ng/mL  POCT Urine Pregnancy     Status: Abnormal   Collection Time: 09/12/23  9:14 AM  Result Value Ref Range   Preg Test, Ur Positive (A) Negative       Assessment & Plan:   Assessment & Plan Possible pregnancy Encounter for pregnancy test, result positive UPT in office today WNL.  Sending prenatal vitamin in for pt.   Will defer to OB/GYN For further treatment decisions.     No follow-ups on file.   Total time spent: 20 minutes  ALAN CHRISTELLA ARRANT, FNP  09/12/2023   This document may have been prepared  by [facility] Voice Recognition software and as such may include unintentional dictation errors.

## 2023-09-13 LAB — BETA HCG QUANT (REF LAB): hCG Quant: 32016 m[IU]/mL

## 2023-09-19 ENCOUNTER — Ambulatory Visit

## 2023-09-19 VITALS — BP 105/75 | HR 74 | Ht 64.0 in | Wt 125.2 lb

## 2023-09-19 DIAGNOSIS — Z3201 Encounter for pregnancy test, result positive: Secondary | ICD-10-CM | POA: Diagnosis not present

## 2023-09-19 DIAGNOSIS — Z3200 Encounter for pregnancy test, result unknown: Secondary | ICD-10-CM

## 2023-09-19 LAB — POCT URINE PREGNANCY: Preg Test, Ur: POSITIVE — AB

## 2023-09-19 NOTE — Progress Notes (Signed)
    NURSE VISIT NOTE  Subjective:    Patient ID: Breanna Lawrence, female    DOB: 1990-11-07, 33 y.o.   MRN: [id number]  HPI  Patient is a 33 y.o. [id number] female who presents for evaluation of amenorrhea. She believes she could be pregnant. Pregnancy is desired.  Current symptoms also include: morning sickness. Last period was normal.    Objective:    BP 105/75   Pulse 74   Ht 5' 4 (1.626 m)   Wt 125 lb 3.2 oz (56.8 kg)   LMP 07/18/2023 Comment: had cycle in May but isn't sure of dates and says it was not a normal level.  BMI 21.49 kg/m   Lab Review  Results for orders placed or performed in visit on 09/19/23  POCT urine pregnancy  Result Value Ref Range   Preg Test, Ur Positive (A) Negative    Assessment:   1. Possible pregnancy     Plan:   Pregnancy Test: Positive  Estimated Date of Delivery: None noted. BP Cuff Measurement taken. Cuff Size Adult Small Encouraged well-balanced diet, plenty of rest when needed, pre-natal vitamins daily and walking for exercise.  Discussed self-help for nausea, avoiding OTC medications until consulting provider or pharmacist, other than Tylenol  as needed, minimal caffeine (1-2 cups daily) and avoiding alcohol.   She will schedule her nurse visit @ 7-[redacted] wks pregnant, u/s for dating @10  wk, and NOB visit at [redacted] wk pregnant.    Feel free to call with any questions.   Phelan Goers H Mittie Knittel, CMA

## 2023-09-26 ENCOUNTER — Telehealth

## 2023-09-26 DIAGNOSIS — Z3490 Encounter for supervision of normal pregnancy, unspecified, unspecified trimester: Secondary | ICD-10-CM | POA: Insufficient documentation

## 2023-09-26 DIAGNOSIS — Z3689 Encounter for other specified antenatal screening: Secondary | ICD-10-CM

## 2023-09-26 DIAGNOSIS — Z3480 Encounter for supervision of other normal pregnancy, unspecified trimester: Secondary | ICD-10-CM | POA: Insufficient documentation

## 2023-09-26 NOTE — Progress Notes (Signed)
 New OB Intake  I connected with  Breanna Lawrence on 09/26/23 at 11:15 AM EDT by MyChart Video Visit and verified that I am speaking with the correct person using two identifiers. Nurse is located at [facility] and pt is located at home.  I discussed the limitations, risks, security and privacy concerns of performing an evaluation and management service by telephone and the availability of in person appointments. I also discussed with the patient that there may be a patient responsible charge related to this service. The patient expressed understanding and agreed to proceed.  I explained I am completing New OB Intake today. We discussed her EDD of 04/23/24 that is based on LMP of 07/18/23. Pt is G1/P0. I reviewed her allergies, medications, Medical/Surgical/OB history, and appropriate screenings. There are cats in the home: no. Based on history, this is a/an pregnancy uncomplicated . Her obstetrical history is significant for N/A.  Patient Active Problem List   Diagnosis Date Noted   Supervision of other normal pregnancy, antepartum 09/26/2023   Mixed hyperlipidemia 07/22/2017   Vitamin D  deficiency, unspecified 07/22/2017    Concerns addressed today: Decreased appetite.  Delivery Plans:  Plans to deliver at [facility].  Anatomy US  Explained first scheduled US  will be 10/01/23. Anatomy US  will be scheduled around [redacted] weeks gestational age.  Labs Discussed genetic screening with patient. Patient desires genetic testing to be drawn at new OB visit. Discussed possible labs to be drawn at new OB appointment.  COVID Vaccine Patient has had COVID vaccine.   Social Determinants of Health Food Insecurity: denies food insecurity  Transportation: Patient denies transportation needs. Childcare: Discussed no children allowed at ultrasound appointments.   First visit review I reviewed new OB appt with pt. I explained she will have blood work and pap smear/pelvic exam if  indicated. Explained pt will be seen by Slater Rains, CNM at first visit; encounter routed to appropriate provider.   Beola Skeens, CMA 09/26/2023  11:27 AM

## 2023-09-26 NOTE — Patient Instructions (Signed)
 First Trimester of Pregnancy  The first trimester of pregnancy starts on the first day of your last monthly period until the end of week 13. This is months 1 through 3 of pregnancy. A week after a sperm fertilizes an egg, the egg will implant into the wall of the uterus and begin to develop into a baby. Body changes during your first trimester Your body goes through many changes during pregnancy. The changes usually return to normal after your baby is born. Physical changes Your breasts may grow larger and may hurt. The area around your nipples may get darker. Your periods will stop. Your hair and nails may grow faster. You may pee more often. Health changes You may tire easily. Your gums may bleed and may be sensitive when you brush and floss. You may not feel hungry. You may have heartburn. You may throw up or feel like you may throw up. You may want to eat some foods, but not others. You may have headaches. You may have trouble pooping (constipation). Other changes Your emotions may change from day to day. You may have more dreams. Follow these instructions at home: Medicines Talk to your health care provider if you're taking medicines. Ask if the medicines are safe to take during pregnancy. Your provider may change the medicines that you take. Do not take any medicines unless told to by your provider. Take a prenatal vitamin that has at least 600 micrograms (mcg) of folic acid. Do not use herbal medicines, illegal substances, or medicines that are not approved by your provider. Eating and drinking While you're pregnant your body needs extra food for your growing baby. Talk with your provider about what to eat while pregnant. Activity Most women are able to exercise during pregnancy. Exercises may need to change as your pregnancy goes on. Talk to your provider about your activities and exercise routines. Relieving pain and discomfort Wear a good, supportive bra if your breasts  hurt. Rest with your legs raised if you have leg cramps or low back pain. Safety Wear your seatbelt at all times when you're in a car. Talk to your provider if someone hits you, hurts you, or yells at you. Talk with your provider if you're feeling sad or have thoughts of hurting yourself. Lifestyle Certain things can be harmful while you're pregnant. Follow these rules: Do not use hot tubs, steam rooms, or saunas. Do not douche. Do not use tampons or scented pads. Do not drink alcohol,smoke, vape, or use products with nicotine or tobacco in them. If you need help quitting, talk with your provider. Avoid cat litter boxes and soil used by cats. These things carry germs that can cause harm to your pregnancy and your baby. General instructions Keep all follow-up visits. It helps you and your unborn baby stay as healthy as possible. Write down your questions. Take them to your visits. Your provider will: Talk with you about your overall health. Give you advice or refer you to specialists who can help with different needs, including: Prenatal education classes. Mental health and counseling. Foods and healthy eating. Ask for help if you need help with food. Call your dentist and ask to be seen. Brush your teeth with a soft toothbrush. Floss gently. Where to find more information American Pregnancy Association: americanpregnancy.org [employer]: acog.org Office on [employer] Health: [employer] Contact a health care provider if: You feel dizzy, faint, or have a fever. You vomit or have watery poop (diarrhea) for 2  days or more. You have abnormal discharge or bleeding from your vagina. You have pain when you pee or your pee smells bad. You have cramps, pain, or pressure in your belly area. Get help right away if: You have trouble breathing or chest pain. You have any kind of injury, such as from a fall or a car crash. These symptoms may be an  emergency. Get help right away. Call 911. Do not wait to see if the symptoms will go away. Do not drive yourself to the hospital. This information is not intended to replace advice given to you by your health care provider. Make sure you discuss any questions you have with your health care provider. Document Revised: 11/21/2022 Document Reviewed: 06/21/2022 Elsevier Patient Education  2024 Elsevier Inc.   Common Medications Safe in Pregnancy  Acne:      Constipation:  Benzoyl Peroxide     Colace  Clindamycin      Dulcolax Suppository  Topica Erythromycin     Fibercon  Salicylic Acid      Metamucil         Miralax AVOID:        Senakot   Accutane    Cough:  Retin-A       Cough Drops  Tetracycline      Phenergan w/ Codeine if Rx  Minocycline      Robitussin (Plain & DM)  Antibiotics:     Crabs/Lice:  Ceclor       RID  Cephalosporins    AVOID:  E-Mycins      Kwell  Keflex  Macrobid/Macrodantin   Diarrhea:  Penicillin      Kao-Pectate  Zithromax      Imodium AD         PUSH FLUIDS AVOID:       Cipro     Fever:  Tetracycline      Tylenol (Regular or Extra  Minocycline       Strength)  Levaquin      Extra Strength-Do not          Exceed 8 tabs/24 hrs Caffeine:        200mg /day (equiv. To 1 cup of coffee or  approx. 3 12 oz sodas)         Gas: Cold/Hayfever:       Gas-X  Benadryl      Mylicon  Claritin       Phazyme  **Claritin-D        Chlor-Trimeton    Headaches:  Dimetapp      ASA-Free Excedrin  Drixoral-Non-Drowsy     Cold Compress  Mucinex (Guaifenasin)     Tylenol (Regular or Extra  Sudafed/Sudafed-12 Hour     Strength)  **Sudafed PE Pseudoephedrine   Tylenol Cold & Sinus     Vicks Vapor Rub  Zyrtec  **AVOID if Problems With Blood Pressure         Heartburn: Avoid lying down for at least 1 hour after meals  Aciphex      Maalox     Rash:  Milk of Magnesia     Benadryl    Mylanta       1% Hydrocortisone Cream  Pepcid  Pepcid Complete   Sleep  Aids:  Prevacid      Ambien   Prilosec       Benadryl  Rolaids       Chamomile Tea  Tums (Limit 4/day)     Unisom  Tylenol PM         Warm milk-add vanilla or  Hemorrhoids:       Sugar for taste  Anusol/Anusol H.C.  (RX: Analapram 2.5%)  Sugar Substitutes:  Hydrocortisone OTC     Ok in moderation  Preparation H      Tucks        Vaseline lotion applied to tissue with wiping    Herpes:     Throat:  Acyclovir      Oragel  Famvir  Valtrex     Vaccines:         Flu Shot Leg Cramps:       *Gardasil  Benadryl      Hepatitis A         Hepatitis B Nasal Spray:       Pneumovax  Saline Nasal Spray     Polio Booster         Tetanus Nausea:       Tuberculosis test or PPD  Vitamin B6 25 mg TID   AVOID:    Dramamine      *Gardasil  Emetrol       Live Poliovirus  Ginger Root 250 mg QID    MMR (measles, mumps &  High Complex Carbs @ Bedtime    rebella)  Sea Bands-Accupressure    Varicella (Chickenpox)  Unisom 1/2 tab TID     *No known complications           If received before Pain:         Known pregnancy;   Darvocet       Resume series after  Lortab        Delivery  Percocet    Yeast:   Tramadol      Femstat  Tylenol 3      Gyne-lotrimin  Ultram       Monistat  Vicodin           MISC:         All Sunscreens           Hair Coloring/highlights          Insect Repellant's          (Including DEET)         Mystic Tans   Commonly Asked Questions During Pregnancy   Cats: A parasite can be excreted in cat feces.  To avoid exposure you need to have another person empty the little box.  If you must empty the litter box you will need to wear gloves.  Wash your hands after handling your cat.  This parasite can also be found in raw or undercooked meat so this should also be avoided.  Colds, Sore Throats, Flu: Please check your medication sheet to see what you can take for symptoms.  If your symptoms are unrelieved by these medications please call the office.  Dental Work: Most  any dental work Agricultural consultant recommends is permitted.  X-rays should only be taken during the first trimester if absolutely necessary.  Your abdomen should be shielded with a lead apron during all x-rays.  Please notify your provider prior to receiving any x-rays.  Novocaine is fine; gas is not recommended.  If your dentist requires a note from [place] prior to dental work please call the office and we will provide one for you.  Exercise: Exercise is an important part of staying healthy during your pregnancy.  You may continue most exercises you were accustomed to prior to pregnancy.  Later in your pregnancy you will most likely notice you have difficulty with activities requiring balance like riding a bicycle.  It is important that you listen to your body and avoid activities that put you at a higher risk of falling.  Adequate rest and staying well hydrated are a must!  If you have questions about the safety of specific activities ask your provider.    Exposure to Children with illness: Try to avoid obvious exposure; report any symptoms to [place] when noted,  If you have chicken pos, red measles or mumps, you should be immune to these diseases.   Please do not take any vaccines while pregnant unless you have checked with your OB provider.  Fetal Movement: After 28 weeks we recommend you do "kick counts" twice daily.  Lie or sit down in a calm quiet environment and count your baby movements "kicks".  You should feel your baby at least 10 times per hour.  If you have not felt 10 kicks within the first hour get up, walk around and have something sweet to eat or drink then repeat for an additional hour.  If count remains less than 10 per hour notify your provider.  Fumigating: Follow your pest control agent's advice as to how long to stay out of your home.  Ventilate the area well before re-entering.  Hemorrhoids:   Most over-the-counter preparations can be used during pregnancy.  Check your medication to see what is  safe to use.  It is important to use a stool softener or fiber in your diet and to drink lots of liquids.  If hemorrhoids seem to be getting worse please call the office.   Hot Tubs:  Hot tubs Jacuzzis and saunas are not recommended while pregnant.  These increase your internal body temperature and should be avoided.  Intercourse:  Sexual intercourse is safe during pregnancy as long as you are comfortable, unless otherwise advised by your provider.  Spotting may occur after intercourse; report any bright red bleeding that is heavier than spotting.  Labor:  If you know that you are in labor, please go to the hospital.  If you are unsure, please call the office and let us help you decide what to do.  Lifting, straining, etc:  If your job requires heavy lifting or straining please check with your provider for any limitations.  Generally, you should not lift items heavier than that you can lift simply with your hands and arms (no back muscles)  Painting:  Paint fumes do not harm your pregnancy, but may make you ill and should be avoided if possible.  Latex or water based paints have less odor than oils.  Use adequate ventilation while painting.  Permanents & Hair Color:  Chemicals in hair dyes are not recommended as they cause increase hair dryness which can increase hair loss during pregnancy.  " Highlighting" and permanents are allowed.  Dye may be absorbed differently and permanents may not hold as well during pregnancy.  Sunbathing:  Use a sunscreen, as skin burns easily during pregnancy.  Drink plenty of fluids; avoid over heating.  Tanning Beds:  Because their possible side effects are still unknown, tanning beds are not recommended.  Ultrasound Scans:  Routine ultrasounds are performed at approximately 20 weeks.  You will be able to see your baby's general anatomy an if you would like to know the gender this can usually be determined as well.  If it is questionable when you conceived you may  also  receive an ultrasound early in your pregnancy for dating purposes.  Otherwise ultrasound exams are not routinely performed unless there is a medical necessity.  Although you can request a scan we ask that you pay for it when conducted because insurance does not cover " patient request" scans.  Work: If your pregnancy proceeds without complications you may work until your due date, unless your physician or employer advises otherwise.  Round Ligament Pain/Pelvic Discomfort:  Sharp, shooting pains not associated with bleeding are fairly common, usually occurring in the second trimester of pregnancy.  They tend to be worse when standing up or when you remain standing for long periods of time.  These are the result of pressure of certain pelvic ligaments called "round ligaments".  Rest, Tylenol and heat seem to be the most effective relief.  As the womb and fetus grow, they rise out of the pelvis and the discomfort improves.  Please notify the office if your pain seems different than that described.  It may represent a more serious condition.

## 2023-10-01 ENCOUNTER — Ambulatory Visit (INDEPENDENT_AMBULATORY_CARE_PROVIDER_SITE_OTHER): Payer: Self-pay

## 2023-10-01 DIAGNOSIS — Z3A09 9 weeks gestation of pregnancy: Secondary | ICD-10-CM | POA: Diagnosis not present

## 2023-10-01 DIAGNOSIS — Z3687 Encounter for antenatal screening for uncertain dates: Secondary | ICD-10-CM

## 2023-10-01 DIAGNOSIS — Z3200 Encounter for pregnancy test, result unknown: Secondary | ICD-10-CM

## 2023-10-05 NOTE — Assessment & Plan Note (Signed)
 Checking labs today.  Will continue supplements as needed.   - Vitamin D  - Vitamin B12 - TSH

## 2023-10-05 NOTE — Assessment & Plan Note (Signed)
 Checking labs today.  Continue current therapy for lipid control. Will modify as needed based on labwork results.   -CMP w/eGFR -Lipid Panel

## 2023-10-08 ENCOUNTER — Ambulatory Visit: Payer: Self-pay | Admitting: Certified Nurse Midwife

## 2023-10-09 NOTE — Progress Notes (Unsigned)
 New Obstetric Patient H&P    Chief Complaint: Desires prenatal care   History of Present Illness: Patient is a 33 y.o. G1P0000 Not Hispanic or Latino female, presents with amenorrhea and positive home pregnancy test. Patient's last menstrual period was 07/18/2023 (approximate). and based on her  LMP, her EDD is Estimated Date of Delivery: 04/23/24 and her EGA is [redacted]w[redacted]d. Cycles are {0-35:19561} {days/wks/mos/yrs:310907}, {Desc; regular/irreg:14544}, and occur approximately every : {numbers 22-35:14824} days. Her last pap smear was {numbers (fuzzy):14653} years ago and was {Findings; lab pap smear results:16707::no abnormalities}.    She had a urine pregnancy test which was positive {numbers (fuzzy):14653} {time frame:9076}  ago. Her last menstrual period was normal and lasted for  {numbers (fuzzy):14653} {time frame:9076}. Since her LMP she claims she has experienced ***. She denies vaginal bleeding. Her past medical history is {Noncontribuatory/Contributory:21644}. Her prior pregnancies are notable for {pregnancy complications:12320}  Since her LMP, she admits to the use of tobacco products  {yes/no:63} She claims she has gained   {inf wt change:14817} pounds since the start of her pregnancy.  There are cats in the home in the home  {yes/no:63} If yes {Desc; indoor/outdoor:13239} She admits close contact with children on a regular basis  {yes/no:63}  She has had chicken pox in the past {yes/no/unknown:74} She has had Tuberculosis exposures, symptoms, or previously tested positive for TB   {yes/no:63} Current or past history of domestic violence. {yes/no:63}  Genetic Screening/Teratology Counseling: (Includes patient, baby's father, or anyone in either family with:)   1. Patient's age >/= 26 at [facility]  {yes/no:63} 2. Thalassemia ([place], [place], Mediterranean, or Asian background): MCV<80  {yes/no:63} 3. Neural tube defect (meningomyelocele, spina bifida, anencephaly)  {yes/no:63} 4.  Congenital heart defect  {yes/no:63}  5. Down syndrome  {yes/no:63} 6. Tay-Sachs (Jewish, [place]  {yes/no:63} 7. Canavan's Disease  {yes/no:63} 8. Sickle cell disease or trait (African)  {yes/no:63}  9. Hemophilia or other blood disorders  {yes/no:63}  10. Muscular dystrophy  {yes/no:63}  11. Cystic fibrosis  {yes/no:63}  12. Huntington's Chorea  {yes/no:63}  13. Mental retardation/autism  {yes/no:63} 14. Other inherited genetic or chromosomal disorder  {yes/no:63} 15. Maternal metabolic disorder (DM, PKU, etc)  {yes/no:63} 16. Patient or FOB with a child with a birth defect not listed above no  16a. Patient or FOB with a birth defect themselves {yes/no:63} 17. Recurrent pregnancy loss, or stillbirth  {yes/no:63}  18. Any medications since LMP other than prenatal vitamins (include vitamins, supplements, OTC meds, drugs, alcohol)  {yes/no:63} 19. Any other genetic/environmental exposure to discuss  {yes/no:63}  Infection History:   1. Lives with someone with TB or TB exposed  {yes/no:63}  2. Patient or partner has history of genital herpes  {yes/no:63} 3. Rash or viral illness since LMP  {yes/no:63} 4. History of STI (GC, CT, HPV, syphilis, HIV)  {yes/no:63} 5. History of recent travel :  {yes/no:63}  Other pertinent information:  {yes/no:63}     Review of Systems:10 point review of systems negative unless otherwise noted in HPI  Past Medical History:  Patient Active Problem List   Diagnosis Date Noted   Supervision of other normal pregnancy, antepartum 09/26/2023     Clinical Staff Provider  Office Location   Ob/Gyn Dating  04/23/2024, by Last Menstrual Period  Language  English Anatomy US     Flu Vaccine  Offer Genetic Screen  NIPS:   TDaP vaccine   Offer Hgb A1C or  GTT Early : Third trimester :   Covid  LAB RESULTS   Rhogam     Blood Type     RSV Offer Antibody    Feeding Plan Formula Rubella    Contraception No RPR     Circumcision Yes HBsAg      Pediatrician  Undecided HIV    Support Person Mom & Best Friend Varicella    Prenatal Classes Yes GBS  (For PCN allergy, check sensitivities)     Hep C     BTL Consent  Pap No results found for: DIAGPAP  VBAC Consent  Hgb Electro      CF      SMA             Mixed hyperlipidemia 07/22/2017   Vitamin D  deficiency, unspecified 07/22/2017    Past Surgical History:  Past Surgical History:  Procedure Laterality Date   ESOPHAGOGASTRODUODENOSCOPY (EGD) WITH PROPOFOL  N/A 09/16/2018   Procedure: ESOPHAGOGASTRODUODENOSCOPY (EGD) WITH PROPOFOL ;  Surgeon: Toledo, Ladell POUR, MD;  Location: [facility] ENDOSCOPY;  Service: Gastroenterology;  Laterality: N/A;   OTHER SURGICAL HISTORY     Hernia, 4-5 yrs old    Gynecologic History: Patient's last menstrual period was 07/18/2023 (approximate).  Obstetric History: G1P0000  Family History:  Family History  Problem Relation Age of Onset   Hypertension Mother    Pancreatic cancer Father 61    Social History:  Social History   Socioeconomic History   Marital status: Single    Spouse name: Not on file   Number of children: 0   Years of education: Not on file   Highest education level: 12th grade  Occupational History   Occupation: Event organiser: [employer]  Tobacco Use   Smoking status: Former    Current packs/day: 0.50    Types: Cigarettes   Smokeless tobacco: Never  Vaping Use   Vaping status: Never Used  Substance and Sexual Activity   Alcohol use: Not Currently    Comment: socially   Drug use: No   Sexual activity: Not Currently    Partners: Male    Birth control/protection: None  Other Topics Concern   Not on file  Social History Narrative   Not on file   Social Drivers of Health   Financial Resource Strain: Low Risk  (09/24/2023)   Overall Financial Resource Strain (CARDIA)    Difficulty of Paying Living Expenses: Not hard at all  Food Insecurity: No Food Insecurity (09/24/2023)   Hunger Vital Sign    Worried  About Running Out of Food in the Last Year: Never true    Ran Out of Food in the Last Year: Never true  Transportation Needs: No Transportation Needs (09/24/2023)   PRAPARE - Administrator, Civil Service (Medical): No    Lack of Transportation (Non-Medical): No  Physical Activity: Inactive (09/24/2023)   Exercise Vital Sign    Days of Exercise per Week: 0 days    Minutes of Exercise per Session: Not on file  Stress: No Stress Concern Present (09/24/2023)   [employer] of Occupational Health - Occupational Stress Questionnaire    Feeling of Stress: Only a little  Social Connections: Moderately Isolated (09/24/2023)   Social Connection and Isolation Panel    Frequency of Communication with Friends and Family: More than three times a week    Frequency of Social Gatherings with Friends and Family: Three times a week    Attends Religious Services: 1 to 4 times per year    Active Member of Clubs or Organizations:  No    Attends Banker Meetings: Not on file    Marital Status: Separated  Intimate Partner Violence: Not At Risk (09/26/2023)   Humiliation, Afraid, Rape, and Kick questionnaire    Fear of Current or Ex-Partner: No    Emotionally Abused: No    Physically Abused: No    Sexually Abused: No    Allergies:  No Known Allergies  Medications: Prior to Admission medications   Medication Sig Start Date End Date Taking? Authorizing Provider  ondansetron  (ZOFRAN -ODT) 4 MG disintegrating tablet Take 1 tablet (4 mg total) by mouth every 8 (eight) hours as needed. 08/21/22   Brimage, Vondra, DO  Prenatal Vit-Fe Fumarate-FA (PRENATAL VITAMIN PLUS LOW IRON ) 27-1 MG TABS Take 1 tablet by mouth daily. 09/12/23   Orlean Alan HERO, FNP  Vitamin D , Ergocalciferol , (DRISDOL ) 1.25 MG (50000 UNIT) CAPS capsule Take 50,000 Units by mouth once a week. 08/23/23   [provider]  famotidine  (PEPCID ) 20 MG tablet Take 1 tablet (20 mg total) by mouth 2 (two) times daily  for 15 days. 02/22/19 03/07/20  Jacolyn Pae, MD  fluticasone  (FLONASE ) 50 MCG/ACT nasal spray Place 2 sprays into both nostrils daily. 02/21/18 11/22/19  Menshew, Candida LULLA Kings, PA-C  loratadine (CLARITIN) 10 MG tablet TK 1 T PO QAM 05/26/18 11/22/19  [provider]    Physical Exam Vitals: Last menstrual period 07/18/2023.  General: NAD HEENT: normocephalic, anicteric Thyroid : no enlargement, no palpable nodules Pulmonary: No increased work of breathing, CTAB Cardiovascular: RRR, distal pulses 2+ Abdomen: NABS, soft, non-tender, non-distended.  Umbilicus without lesions.  No hepatomegaly, splenomegaly or masses palpable. No evidence of hernia  Genitourinary:  External: Normal external female genitalia.  Normal urethral meatus, normal  Bartholin's and Skene's glands.    Vagina: Normal vaginal mucosa, no evidence of prolapse.    Cervix: Grossly normal in appearance, no bleeding  Uterus: *** Non-enlarged, mobile, normal contour.  No CMT  Adnexa: ovaries non-enlarged, no adnexal masses  Rectal: deferred Extremities: no edema, erythema, or tenderness Neurologic: Grossly intact Psychiatric: mood appropriate, affect full   Assessment: 33 y.o. G1P0000 at [redacted]w[redacted]d presenting to initiate prenatal care  Plan: 1) Avoid alcoholic beverages. 2) Patient encouraged not to smoke.  3) Discontinue the use of all non-medicinal drugs and chemicals.  4) Take prenatal vitamins daily.  5) Nutrition, food safety (fish, cheese advisories, and high nitrite foods) and exercise discussed. 6) Hospital and practice style discussed with cross coverage system.  7) Genetic Screening, such as with 1st Trimester Screening, cell free fetal DNA, AFP testing, and Ultrasound, as well as with amniocentesis and CVS as appropriate, is discussed with patient. At the conclusion of today's visit patient {Desc; requested/declined/undecided:14580} genetic testing 8) Patient is asked about travel to areas at risk for  the Zika virus, and counseled to avoid travel and exposure to mosquitoes or sexual partners who may have themselves been exposed to the virus. Testing is discussed, and will be ordered as appropriate.   [employer], [place] 03/05/7972 6:56 PM

## 2023-10-10 ENCOUNTER — Ambulatory Visit (INDEPENDENT_AMBULATORY_CARE_PROVIDER_SITE_OTHER): Payer: Self-pay | Admitting: Advanced Practice Midwife

## 2023-10-10 ENCOUNTER — Encounter: Payer: Self-pay | Admitting: Advanced Practice Midwife

## 2023-10-10 ENCOUNTER — Other Ambulatory Visit (HOSPITAL_COMMUNITY)
Admission: RE | Admit: 2023-10-10 | Discharge: 2023-10-10 | Disposition: A | Discharge status: Home / Self Care | Payer: Self-pay | Source: Ambulatory Visit | Attending: Advanced Practice Midwife | Admitting: Advanced Practice Midwife

## 2023-10-10 VITALS — BP 100/76 | HR 72 | Wt 129.2 lb

## 2023-10-10 DIAGNOSIS — Z3401 Encounter for supervision of normal first pregnancy, first trimester: Secondary | ICD-10-CM | POA: Insufficient documentation

## 2023-10-10 DIAGNOSIS — Z131 Encounter for screening for diabetes mellitus: Secondary | ICD-10-CM

## 2023-10-10 DIAGNOSIS — Z130 Encounter for screening for diseases of the blood and blood-forming organs and certain disorders involving the immune mechanism: Secondary | ICD-10-CM

## 2023-10-10 DIAGNOSIS — Z3A10 10 weeks gestation of pregnancy: Secondary | ICD-10-CM | POA: Insufficient documentation

## 2023-10-10 DIAGNOSIS — Z01419 Encounter for gynecological examination (general) (routine) without abnormal findings: Secondary | ICD-10-CM | POA: Diagnosis present

## 2023-10-10 DIAGNOSIS — Z113 Encounter for screening for infections with a predominantly sexual mode of transmission: Secondary | ICD-10-CM

## 2023-10-10 DIAGNOSIS — Z124 Encounter for screening for malignant neoplasm of cervix: Secondary | ICD-10-CM

## 2023-10-10 DIAGNOSIS — Z1379 Encounter for other screening for genetic and chromosomal anomalies: Secondary | ICD-10-CM

## 2023-10-10 NOTE — Progress Notes (Signed)
 [facility]  New Obstetric Patient H&P    Chief Complaint: Desires prenatal care   History of Present Illness: Patient is a 33 y.o. G1P0000 Not Hispanic or Latino female, presents with amenorrhea and positive home pregnancy test. Patient's last menstrual period was 07/18/2023 (approximate). and based on her  LMP, her EDD is Estimated Date of Delivery: 05/05/24 and her EGA is [redacted]w[redacted]d. Cycles are 5-7 days, regular, and occur approximately every : 28 days. Her last pap smear was 4 years ago and was no abnormalities.    She had a urine pregnancy test which was positive 1 month(s)  ago. Her last menstrual period was normal and lasted for  5 or 6 day(s). Since her LMP she claims she has experienced breast tenderness, fatigue, nausea, vomiting. She denies vaginal bleeding. Her past medical history is noncontributory.   Since her LMP, she admits to the use of tobacco products  no She claims she has gained 8 pounds since the start of her pregnancy.  There are cats in the home in the home  no  She admits close contact with children on a regular basis  no  She has had chicken pox in the past no She has had Tuberculosis exposures, symptoms, or previously tested positive for TB   no Current or past history of domestic violence. no  Genetic Screening/Teratology Counseling: (Includes patient, baby's father, or anyone in either family with:)   1. Patient's age >/= 72 at [facility]  no 2. Thalassemia ([place], [place], [place], or Asian background): MCV<80  no 3. Neural tube defect (meningomyelocele, spina bifida, anencephaly)  no 4. Congenital heart defect  no  5. Down syndrome  no 6. Tay-Sachs (Jewish, [place]  no 7. Canavan's Disease  no 8. Sickle cell disease or trait (African)  no  9. Hemophilia or other blood disorders  no  10. Muscular dystrophy  no  11. Cystic fibrosis  no  12. Huntington's Chorea  no  13. Mental retardation/autism  no 14. Other inherited genetic or chromosomal  disorder  no 15. Maternal metabolic disorder (DM, PKU, etc)  no 16. Patient or FOB with a child with a birth defect not listed above no  16a. Patient or FOB with a birth defect themselves no 17. Recurrent pregnancy loss, or stillbirth  no  18. Any medications since LMP other than prenatal vitamins (include vitamins, supplements, OTC meds, drugs, alcohol)  Marijuana from time to time 19. Any other genetic/environmental exposure to discuss  no  Infection History:   1. Lives with someone with TB or TB exposed  no  2. Patient or partner has history of genital herpes  no 3. Rash or viral illness since LMP  no 4. History of STI (GC, CT, HPV, syphilis, HIV)  no 5. History of recent travel :  no  Other pertinent information:  no     Review of Systems:10 point review of systems negative unless otherwise noted in HPI  Past Medical History:  Patient Active Problem List   Diagnosis Date Noted   Supervision of normal pregnancy 09/26/2023     Clinical Staff Provider  Office Location  [facility] Dating  04/23/2024, by Last Menstrual Period  Language  English Anatomy US     Flu Vaccine  Offer Genetic Screen  NIPS:   TDaP vaccine   Offer Hgb A1C or  GTT Early : Third trimester :   Covid    LAB RESULTS   Rhogam     Blood Type  RSV Offer Antibody    Feeding Plan Formula Rubella    Contraception No RPR     Circumcision Yes HBsAg     Pediatrician  Undecided HIV    Support Person Mom & Best Friend Varicella    Prenatal Classes Yes GBS  (For PCN allergy, check sensitivities)     Hep C     BTL Consent  Pap No results found for: DIAGPAP  VBAC Consent NA Hgb Electro      CF      SMA             Mixed hyperlipidemia 07/22/2017   Vitamin D  deficiency, unspecified 07/22/2017    Past Surgical History:  Past Surgical History:  Procedure Laterality Date   ESOPHAGOGASTRODUODENOSCOPY (EGD) WITH PROPOFOL  N/A 09/16/2018   Procedure: ESOPHAGOGASTRODUODENOSCOPY (EGD) WITH PROPOFOL ;   Surgeon: Toledo, Ladell POUR, MD;  Location: [facility] ENDOSCOPY;  Service: Gastroenterology;  Laterality: N/A;   OTHER SURGICAL HISTORY     Hernia, 4-5 yrs old    Gynecologic History: Patient's last menstrual period was 07/18/2023 (approximate).  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[employer]  Tobacco Use   Smoking status: Former    Current packs/day: 0.50    Types: Cigarettes   Smokeless tobacco: Never  Vaping Use   Vaping status: Never Used  Substance and Sexual Activity   Alcohol use: Not Currently    Comment: socially   Drug use: No   Sexual activity: Not Currently    Partners: Male    Birth control/protection: None  Other Topics Concern   Not on file  Social History Narrative   Not on file   Social Drivers of Health   Financial Resource Strain: Low Risk  (09/24/2023)   Overall Financial Resource Strain (CARDIA)    Difficulty of Paying Living Expenses: Not hard at all  Food Insecurity: No Food Insecurity (09/24/2023)   Hunger Vital Sign    Worried About Running Out of Food in the Last Year: Never true    Ran Out of Food in the Last Year: Never true  Transportation Needs: No Transportation Needs (09/24/2023)   PRAPARE - Administrator, Civil Service (Medical): No    Lack of Transportation (Non-Medical): No  Physical Activity: Inactive (09/24/2023)   Exercise Vital Sign    Days of Exercise per Week: 0 days    Minutes of Exercise per Session: Not on file  Stress: No Stress Concern Present (09/24/2023)   [employer] of Occupational Health - Occupational Stress Questionnaire     Feeling of Stress: Only a little  Social Connections: Moderately Isolated (09/24/2023)   Social Connection and Isolation Panel    Frequency of Communication with Friends and Family: More than three times a week    Frequency of Social Gatherings with Friends and Family: Three times a week    Attends Religious Services: 1 to 4 times per year    Active Member of Clubs or Organizations: No    Attends Banker Meetings: Not on file  Marital Status: Separated  Intimate Partner Violence: Not At Risk (09/26/2023)   Humiliation, Afraid, Rape, and Kick questionnaire    Fear of Current or Ex-Partner: No    Emotionally Abused: No    Physically Abused: No    Sexually Abused: No    Allergies:  No Known Allergies  Medications: Prior to Admission medications   Medication Sig Start Date End Date Taking? Authorizing Provider  Prenatal Vit-Fe Fumarate-FA (PRENATAL VITAMIN PLUS LOW IRON ) 27-1 MG TABS Take 1 tablet by mouth daily. 09/12/23  Yes Orlean Alan HERO, FNP  Vitamin D , Ergocalciferol , (DRISDOL ) 1.25 MG (50000 UNIT) CAPS capsule Take 50,000 Units by mouth once a week. 08/23/23  Yes [provider]  ondansetron  (ZOFRAN -ODT) 4 MG disintegrating tablet Take 1 tablet (4 mg total) by mouth every 8 (eight) hours as needed. 08/21/22   Brimage, Vondra, DO  famotidine  (PEPCID ) 20 MG tablet Take 1 tablet (20 mg total) by mouth 2 (two) times daily for 15 days. 02/22/19 03/07/20  Jacolyn Pae, MD  fluticasone  (FLONASE ) 50 MCG/ACT nasal spray Place 2 sprays into both nostrils daily. 02/21/18 11/22/19  Menshew, Candida LULLA Kings, PA-C  loratadine (CLARITIN) 10 MG tablet TK 1 T PO QAM 05/26/18 11/22/19  [provider]    Physical Exam Vitals: Blood pressure 100/76, pulse 72, weight 129 lb 3.2 oz (58.6 kg), last menstrual period 07/18/2023.  General: NAD HEENT: normocephalic, anicteric Thyroid : no enlargement, no palpable nodules Pulmonary: No increased work of breathing,  CTAB Cardiovascular: RRR, distal pulses 2+ Abdomen: NABS, soft, non-tender, non-distended.  Umbilicus without lesions.  No evidence of hernia. FHTs by doppler 175 Genitourinary:  External: Normal external female genitalia.  Normal urethral meatus, normal  Bartholin's and Skene's glands.    Vagina: Normal vaginal mucosa, no evidence of prolapse.    Cervix: Grossly normal in appearance, no bleeding Extremities: no edema, erythema, or tenderness Neurologic: Grossly intact Psychiatric: mood appropriate, affect full   The following were addressed during this visit:  Breastfeeding Education - Early initiation of breastfeeding    Comments: Keeps milk supply adequate, helps contract uterus and slow bleeding, and early milk is the perfect first food and is easy to digest.   - The importance of exclusive breastfeeding    Comments: Provides antibodies, Lower risk of breast and ovarian cancers, and type-2 diabetes,Helps your body recover, Reduced chance of SIDS.   - Risks of giving your baby anything other than breast milk if you are breastfeeding    Comments: Make the baby less content with breastfeeds, may make my baby more susceptible to illness, and may reduce my milk supply.   - The importance of early skin-to-skin contact    Comments:  Keeps baby warm and secure, helps keep baby's blood sugar up and breathing steady, easier to bond and breastfeed, and helps calm baby.  - Rooming-in on a 24-hour basis    Comments: Easier to learn baby's feeding cues, easier to bond and get to know each other, and encourages milk production.   - Feeding on demand or baby-led feeding    Comments: Helps prevent breastfeeding complications, helps bring in good milk supply, prevents under or overfeeding, and helps baby feel content and satisfied   - Frequent feeding to help assure optimal milk production    Comments: Making a full supply of milk requires frequent removal of milk from breasts, infant will  eat 8-12 times in 24 hours, if separated from infant use breast massage, hand expression and/ or pumping to remove  milk from breasts.   - Effective positioning and attachment    Comments: Helps my baby to get enough breast milk, helps to produce an adequate milk supply, and helps prevent nipple pain and damage   - Exclusive breastfeeding for the first 6 months    Comments: Builds a healthy milk supply and keeps it up, protects baby from sickness and disease, and breastmilk has everything your baby needs for the first 6 months.    Assessment: 33 y.o. G1P0000 at [redacted]w[redacted]d presenting to initiate prenatal care  Plan: 1) Avoid alcoholic beverages. 2) Patient encouraged not to smoke.  3) Discontinue the use of all non-medicinal drugs and chemicals.  4) Take prenatal vitamins daily.  5) Nutrition, food safety (fish, cheese advisories, and high nitrite foods) and exercise discussed. 6) Hospital and practice style discussed with cross coverage system.  7) Genetic Screening, such as with 1st Trimester Screening, cell free fetal DNA, AFP testing, and Ultrasound, as well as with amniocentesis and CVS as appropriate, is discussed with patient. At the conclusion of today's visit patient requested genetic testing 8) Patient is asked about travel to areas at risk for the Zika virus, and counseled to avoid travel and exposure to mosquitoes or sexual partners who may have themselves been exposed to the virus. Testing is discussed, and will be ordered as appropriate.  9) NOB labs today 10) Return to clinic in 4 weeks for ROB   Slater Rains, CNM [facility] 10/10/2023 5:30 PM

## 2023-10-10 NOTE — Patient Instructions (Signed)
 Prenatal Care Prenatal care is health care you get when pregnant. It helps you and your unborn baby stay as healthy as possible. Start prenatal care early in your pregnancy and continue to go to visits during your pregnancy. Prenatal care may be given by a midwife, a family practice doctor, a Publishing rights manager, physician assistant, or a childbirth and pregnancy doctor. What are the benefits of prenatal care? In prenatal care, your health care provider will get to know your medical history. You'll be checked for conditions that might affect you and your baby. Prenatal care will: Lower the risk for problems as your child grows. Lower certain risks for your baby, especially the risk that: Your child may be born early. Your child will have a low weight at birth. What can I expect at the first prenatal care visit? Your first visit will likely be the longest. You should ask to be seen as soon as you know you're pregnant. The first visit is a good time to talk about any questions or concerns. Make a list of questions to ask your provider at your visits. Medical history At your visit, you and your provider will talk about your medical history, including: Your family's medical history and the medical history of the baby's father. Any past pregnancies and long-term (chronic) health conditions. Any surgeries or procedures you have had. All medicines you're taking. Tell them if you're taking herbs or supplements too. Any tobacco, alcohol, or drug use. Other problems that may harm you and your baby. Tell them if: You need food or housing. You have been around chemicals or radiation. Your partner yells at you, hits you, or hurts you. Tests and screenings Your provider will: Do a physical exam, including a pelvic and breast exam. Do tests to check for: Urinary tract infection (UTI). Sexually transmitted infections (STIs). Low iron levels in your blood. This is called anemia. Blood type and certain  proteins on red blood cells called Rh antibodies. Infections and immunity to viruses, such as hepatitis B and rubella. HIV. Ask your provider if you need to be checked for genetic diseases. Tips about staying healthy Your provider will also give you information about how to keep yourself and your baby healthy, including: Nutrition, vitamins, and food safety. Physical activity. How to treat some problems, such as morning sickness. How to avoid infections and substances that may harm your baby. Caring for your teeth. Work and travel. Problems that require you to call your provider. How often will I have prenatal care visits? After your first prenatal care visit, you will have regular visits throughout your pregnancy. You may visit your provider as follows: Up to week 28 of pregnancy: once every 4 weeks. 28-36 weeks: once every 2 weeks. After 36 weeks: every week until delivery. Some people may have more visits. Others may have fewer. It all depends on your health and that of your baby. Keep all prenatal visits. This is one way for you and your baby to stay as healthy as possible. What happens during routine prenatal care visits? Your provider will: Check your weight and blood pressure. Check your baby's heart sounds. Ask questions about your diet, exercise, sleeping patterns, and whether you can feel the baby move. Ask about any pregnancy symptoms you're having and how you're dealing with them. Tell your provider if: You throw up or feel like you may throw up. You have discharge or you bleed from your vagina. You have trouble pooping (constipation). You have swelling, headaches, or trouble  seeing. You are very tired, or you feel sad and anxious all the time. You have discomfort, including back pain or pain in the pelvis. Tell you problems to watch for during your pregnancy, including signs of labor. Measure the height of your uterus in your belly. This is called fundal height. What  tests might I have during prenatal care visits? You may have blood, urine, and imaging tests. These may include: Urine tests to check for blood sugar, protein, or signs of infection. Genetic testing. Ultrasounds to check your baby's growth, development, and well-being. Your baby may also be checked for congenital conditions. Glucose tests to check for gestational diabetes. This is a form of diabetes that a person can get when pregnant. A test to check for group B strep (GBS) infection. What else can I expect during prenatal care visits? Your provider may give you some vaccines. Getting certain vaccines during pregnancy can protect your baby after birth. These may include: A flu shot. Tdap (tetanus, diphtheria, pertussis) vaccine. A COVID-19 vaccine. A RSV vaccine. Later in your pregnancy, your provider may talk to you about: Childbirth and childbirth classes. Breastfeeding and breastfeeding classes. Birth control after your baby is born. Where to find more information Office on Women's Health: TravelLesson.ca American Pregnancy Association: americanpregnancy.org March of Dimes: marchofdimes.org This information is not intended to replace advice given to you by your health care provider. Make sure you discuss any questions you have with your health care provider. Document Revised: 06/24/2022 Document Reviewed: 06/24/2022 Elsevier Patient Education  2024 Elsevier Inc.Pregnancy: Healthy Eating While you're pregnant, your body needs extra nutrition for your growing baby. You also need more vitamins and minerals, such as folic acid, calcium, iron, and vitamin D. Eating a balanced diet is important for both you and your baby. Your need for extra calories will change during pregnancy. During the first 3 months of pregnancy, called the first trimester, you don't need more calories. During the second trimester, you'll need about 340 extra calories a day. During the third trimester, you'll need  about 450 extra calories a day. If you're carrying more than one baby, talk with your health care provider or a dietitian to learn more about your specific eating needs. What are tips for eating healthy during pregnancy? Meal planning  Eating smaller meals throughout the day may help manage some side effects common in pregnancy, like heartburn and reflux. Eat a variety of foods. Be sure to include many types of fruits and vegetables. Two or more servings of fish are recommended each week. Choose fish that are lower in mercury, such as salmon and pollock. Limit foods that have empty calories. These are foods that have little nutritional value, such as sweets, desserts, candies, and drinks with sugar in them. Drinks that have caffeine are OK to drink, but it's better to avoid caffeine. Limit your total caffeine intake to less than 200 mg each day, or the limit you're told by your provider. Be aware that 200 mg of caffeine is 12 oz or 355 mL of coffee, tea, or soda. General information Take a prenatal vitamin to help meet your vitamin and mineral needs during pregnancy. This includes your need for folic acid, iron, calcium, and vitamin D. Do not try to lose weight or go on a diet during pregnancy. Food safety  Wash your hands before you eat and after you prepare raw meat. Wash all fruits and vegetables well before peeling or eating. Make sure that all meats, poultry, and eggs are  cooked to food-safe temperatures or well-done. Taking these actions can help keep your food safe and protect you and your baby from dangerous food illnesses. Ask your provider for more information. What foods should I eat? Fruits All fruits. Eat a variety of colors and types of fruit. Remember to wash your fruits well before peeling or eating. Vegetables All vegetables. Eat a variety of colors and types of vegetables. Remember to wash your vegetables well before peeling or eating. Grains All grains. Choose whole  grains, such as whole-wheat bread, oatmeal, or brown rice. Meats and other protein foods Lean meats, including chicken, [place], and lean cuts of beef, veal, or pork. Fish that is higher in omega-3 fatty acids and lower in mercury, such as salmon, herring, mussels, trout, sardines, pollock, shrimp, crab, and lobster. Tofu. Tempeh. Beans. Eggs. Peanut butter and other nut butters. Dairy Pasteurized milk and milk alternatives, such as soy milk. Pasteurized yogurt and pasteurized cheese. Cottage cheese. Sour cream. Beverages Water. Juices that contain 100% fruit juice or vegetable juice. Caffeine-free teas and decaffeinated coffee. Fats and oils Fats and oils are OK to include in moderation. Sweets and desserts Sweets and desserts are OK to include in moderation. Seasoning and other foods All pasteurized condiments. The items listed above may not be all the foods and drinks you can have. Talk with a dietitian to learn more. What does 340 extra calories look like? Healthy snacks that give you 340 more calories a day could be: Peanut butter and jelly with milk: 8 oz (237 mL) of low-fat milk. Peanut butter and jelly sandwich made with: 1 slice of whole-wheat bread. 2 teaspoons (10 g) of peanut butter. Yogurt and berries: 1 cup (245 g) of [place] yogurt. 1 cup (150 g) of berries. 2 tablespoons (30 g) of chopped nuts, such as almonds or walnuts. Avocado toast: 1 slice of whole-wheat bread. 1/2 medium avocado (70 g). 1 large egg (50 g). What foods should I avoid? Fruits Raw (unpasteurized) fruit juices. Vegetables Unpasteurized vegetable juices. Meats and other protein foods Precooked or cured meat, such as bologna, hot dogs, sausages, or meat loaves. (If you must eat those meats, reheat them until they are steaming hot.) Refrigerated pate, meat spreads from a meat counter, or smoked seafood that's found in the refrigerated section of a store. Raw or undercooked meats, poultry, and  eggs. Raw fish, such as sushi or sashimi. Fish that have high mercury content, such as tilefish, shark, swordfish, and king mackerel. Dairy Unpasteurized or raw milk and any foods that are made from them. Some of these may be: Homemade yogurts or puddings. Soft cheeses such as: Feta. Queso blanco or fresco. Pharmacist, hospital or Standard. Blue-veined cheeses. Some of these types of cheeses may be made with pasteurized milk. Check the label. If pasteurized milk is used, they are OK to eat during pregnancy. Deli foods Premade foods from a store or deli, like chicken salad, coleslaw, or egg salad. These are riskier for food illness than fresh or homemade salads. Beverages Alcohol. Sugar-sweetened drinks, such as sodas or teas. Energy drinks. Seasoning and other foods Homemade fermented foods and drinks, such as: Pickles. Sauerkraut. Kombucha. Store-bought pasteurized versions of these are OK. The items listed above may not be all the foods and drinks you should avoid. Talk with a dietitian to learn more. Where to find more information To learn more, go to: Centers for Disease Control and Prevention at TonerPromos.no. Click Search and type food choices for pregnancy. Find the link you need.  MyPlate at http://pittman-dennis.biz/. This information is not intended to replace advice given to you by your health care provider. Make sure you discuss any questions you have with your health care provider. Document Revised: 02/05/2023 Document Reviewed: 02/05/2023 Elsevier Patient Education  2025 Elsevier Inc.Exercise During Pregnancy Exercise is an important part of being healthy for people of all ages. Exercise helps your heart and lungs work well. Exercise also: Helps you stay strong and flexible. Helps you keep a healthy body weight. Boosts your energy levels and improves your mood. You should try to exercise regularly during pregnancy. Exercise routines may need to change later in your pregnancy. In rare  cases, certain medical problems in your pregnancy may limit the exercise you can do during pregnancy. Your health care provider will give you information on what exercises will work for you. How does exercise help during pregnancy? Along with staying strong and flexible, exercising during pregnancy can help: Keep strength in muscles that are used during labor and birth. Control weight gain. Speed up your recovery after giving birth. Reduce the need for insulin if you get diabetes during pregnancy. Decrease low back pain. Lower the risk for depression. Lower the risk of cesarean delivery. Treat trouble pooping (constipation). How does exercise affect my baby? Exercise can help you have a healthy pregnancy. Exercise does not cause your baby to be born early. It will not cause your baby to weigh less at birth. What exercises can I do? Many exercises are safe for you to do during pregnancy. Do a variety of exercises that safely increase your heart and breathing rates and help you build and maintain muscle strength. Do exercises as told by your provider. Your provider may recommend: Walking. Swimming. Water aerobics. Riding a stationary bike. Modified yoga or Pilates. Tell your instructor that you're pregnant. Avoid overstretching. Avoid lying on your back for long periods of time. Resistance exercises with weights or elastic bands. Running or jogging. Choose this type of exercise only if: You ran or jogged regularly before your pregnancy. You can run or jog and still talk in full sentences. What exercises should I avoid? You may be told to limit high-intensity exercise depending on your level of fitness and if you exercised regularly before you became pregnant. You can tell that you're exercising at a high intensity if you're breathing much harder and faster and can't hold a conversation while exercising. You may be told to: Avoid jogging or running, unless you jogged or ran regularly before  you became pregnant. Do not run or jog so fast that you're unable to have a conversation. Avoid activities that put you at risk for falling on your belly or getting hit in the belly. Some of these are: Downhill skiing. Rock climbing. Cycling and gymnastics. Horseback riding. Surfing and waterskiing. Contact sports. Avoid scuba diving. Avoid skydiving. Avoid activities that take place in a room that's heated to high temperatures, such as hot yoga or hot Pilates. How do I exercise in a safe way?  Start slowly. Ask your provider to recommend the types of exercise that are safe for you. Avoid overheating. Do not exercise in very high temperatures or hot rooms. Avoid hot yoga or hot Pilates. Avoid standing still or lying flat on your back as much as you can. Avoid losing too much fluid (dehydration). Drink more fluids as told. Drink before, during, and after you exercise. Avoid overstretching. Because of hormone changes during pregnancy, it's easy to overstretch muscles, tendons, and ligaments. Ligaments are the  tissues that connect bones to each other. Do not exercise to lose weight. Do not exercise at more than 6,000 feet above sea level (high elevation) if you don't live at that elevation. Tips and recommendations Wear loose-fitting, breathable clothes. Wear a sports bra to support your breasts. Exercise on most days or all days of the week. Try to exercise for 30 minutes a day, 5 days a week. If problems come up during your pregnancy, you provider may tell you to limit some exercises or to exercise less. If you have concerns, ask your provider. If you actively exercised before your pregnancy, your provider may tell you to continue to do moderate-intensity to high-intensity exercise. If you're just starting to exercise or didn't exercise much before your pregnancy, your provider may tell you to do low-intensity to moderate-intensity exercise. Questions to ask your health care provider Is  exercise safe for me? What are signs that I should stop exercising? Does my health condition mean that I should not exercise during pregnancy? When should I avoid exercising during pregnancy? Stop exercising and contact a health care provider if: You have any unusual symptoms, such as: Mild contractions or cramps in the belly. Dizziness that does not go away when you rest. Headache. Pain and swelling of your calves. Bleeding or fluid leaking from your vagina. Stop exercising and get help right away if: You have: Chest pain. Shortness of breath. Sudden, severe pain in your low back or your belly. Regular, painful contractions before 37 weeks of pregnancy. These symptoms may be an emergency. Call 911 right away. Do not wait to see if the symptoms will go away. Do not drive yourself to the hospital. This information is not intended to replace advice given to you by your health care provider. Make sure you discuss any questions you have with your health care provider. Document Revised: 10/14/2022 Document Reviewed: 10/14/2022 Elsevier Patient Education  2024 [employer].

## 2023-10-12 LAB — PROTEIN / CREATININE RATIO, URINE
Creatinine, Urine: 90.9 mg/dL
Protein, Ur: 12.8 mg/dL
Protein/Creat Ratio: 141 mg/g{creat} (ref 0–200)

## 2023-10-12 LAB — URINALYSIS, ROUTINE W REFLEX MICROSCOPIC
Bilirubin, UA: NEGATIVE
Glucose, UA: NEGATIVE
Ketones, UA: NEGATIVE
Leukocytes,UA: NEGATIVE
Nitrite, UA: NEGATIVE
Protein,UA: NEGATIVE
RBC, UA: NEGATIVE
Specific Gravity, UA: 1.016 (ref 1.005–1.030)
Urobilinogen, Ur: 0.2 mg/dL (ref 0.2–1.0)
pH, UA: 5.5 (ref 5.0–7.5)

## 2023-10-12 LAB — URINE CULTURE, OB REFLEX: Organism ID, Bacteria: NO GROWTH

## 2023-10-12 LAB — CULTURE, OB URINE

## 2023-10-14 LAB — MONITOR DRUG PROFILE 14(MW)
Amphetamine Scrn, Ur: NEGATIVE ng/mL
BARBITURATE SCREEN URINE: NEGATIVE ng/mL
BENZODIAZEPINE SCREEN, URINE: NEGATIVE ng/mL
Buprenorphine, Urine: NEGATIVE ng/mL
Cocaine (Metab) Scrn, Ur: NEGATIVE ng/mL
Creatinine(Crt), U: 96.3 mg/dL (ref 20.0–300.0)
Fentanyl, Urine: NEGATIVE pg/mL
Meperidine Screen, Urine: NEGATIVE ng/mL
Methadone Screen, Urine: NEGATIVE ng/mL
OXYCODONE+OXYMORPHONE UR QL SCN: NEGATIVE ng/mL
Opiate Scrn, Ur: NEGATIVE ng/mL
Ph of Urine: 5.2 (ref 4.5–8.9)
Phencyclidine Qn, Ur: NEGATIVE ng/mL
Propoxyphene Scrn, Ur: NEGATIVE ng/mL
SPECIFIC GRAVITY: 1.016
Tramadol Screen, Urine: NEGATIVE ng/mL

## 2023-10-14 LAB — COMPREHENSIVE METABOLIC PANEL WITH GFR
ALT: 9 IU/L (ref 0–32)
AST: 15 IU/L (ref 0–40)
Albumin: 4.3 g/dL (ref 3.9–4.9)
Alkaline Phosphatase: 53 IU/L (ref 44–121)
BUN/Creatinine Ratio: 18 (ref 9–23)
BUN: 9 mg/dL (ref 6–20)
Bilirubin Total: 0.2 mg/dL (ref 0.0–1.2)
CO2: 20 mmol/L (ref 20–29)
Calcium: 9.4 mg/dL (ref 8.7–10.2)
Chloride: 98 mmol/L (ref 96–106)
Creatinine, Ser: 0.5 mg/dL — ABNORMAL LOW (ref 0.57–1.00)
Globulin, Total: 2.6 g/dL (ref 1.5–4.5)
Glucose: 64 mg/dL — ABNORMAL LOW (ref 70–99)
Potassium: 4.1 mmol/L (ref 3.5–5.2)
Sodium: 135 mmol/L (ref 134–144)
Total Protein: 6.9 g/dL (ref 6.0–8.5)
eGFR: 127 mL/min/1.73 (ref >59)

## 2023-10-14 LAB — CBC/D/PLT+RPR+RH+ABO+RUBIGG...
Antibody Screen: NEGATIVE
Basophils Absolute: 0 x10E3/uL (ref 0.0–0.2)
Basos: 0 %
EOS (ABSOLUTE): 0.1 x10E3/uL (ref 0.0–0.4)
Eos: 1 %
HCV Ab: NONREACTIVE
HIV Screen 4th Generation wRfx: NONREACTIVE
Hematocrit: 35.9 % (ref 34.0–46.6)
Hemoglobin: 11.4 g/dL (ref 11.1–15.9)
Hepatitis B Surface Ag: NEGATIVE
Immature Grans (Abs): 0 x10E3/uL (ref 0.0–0.1)
Immature Granulocytes: 0 %
Lymphocytes Absolute: 1.2 x10E3/uL (ref 0.7–3.1)
Lymphs: 18 %
MCH: 30 pg (ref 26.6–33.0)
MCHC: 31.8 g/dL (ref 31.5–35.7)
MCV: 95 fL (ref 79–97)
Monocytes Absolute: 0.3 x10E3/uL (ref 0.1–0.9)
Monocytes: 5 %
Neutrophils Absolute: 4.8 x10E3/uL (ref 1.4–7.0)
Neutrophils: 76 %
Platelets: 228 x10E3/uL (ref 150–450)
RBC: 3.8 x10E6/uL (ref 3.77–5.28)
RDW: 13.3 % (ref 11.7–15.4)
RPR Ser Ql: NONREACTIVE
Rh Factor: POSITIVE
Rubella Antibodies, IGG: 2.52 {index} (ref >0.99)
Varicella zoster IgG: REACTIVE
WBC: 6.3 x10E3/uL (ref 3.4–10.8)

## 2023-10-14 LAB — HGB FRACTIONATION CASCADE
Hgb A2: 3.1 % (ref 1.8–3.2)
Hgb A: 56.3 % — ABNORMAL LOW (ref 96.4–98.8)
Hgb F: 0.5 % (ref 0.0–2.0)
Hgb S: 40.1 % — ABNORMAL HIGH

## 2023-10-14 LAB — CYTOLOGY - PAP
Chlamydia: NEGATIVE
Comment: NEGATIVE
Comment: NEGATIVE
Comment: NEGATIVE
Comment: NORMAL
Diagnosis: NEGATIVE
High risk HPV: NEGATIVE
Neisseria Gonorrhea: NEGATIVE
Trichomonas: NEGATIVE

## 2023-10-14 LAB — HGB A1C W/O EAG: Hgb A1c MFr Bld: 5 % (ref 4.8–5.6)

## 2023-10-14 LAB — CANNABINOID (GC/MS), URINE
Cannabinoid: POSITIVE — AB
Carboxy THC (GC/MS): 481 ng/mL

## 2023-10-14 LAB — HCV INTERPRETATION

## 2023-10-14 LAB — HGB SOLUBILITY: Hgb Solubility: POSITIVE — AB

## 2023-10-14 LAB — NICOTINE SCREEN, URINE: Cotinine Ql Scrn, Ur: NEGATIVE ng/mL

## 2023-10-15 ENCOUNTER — Other Ambulatory Visit: Payer: Self-pay

## 2023-10-15 ENCOUNTER — Emergency Department: Payer: Self-pay

## 2023-10-15 ENCOUNTER — Emergency Department
Admission: EM | Admit: 2023-10-15 | Discharge: 2023-10-16 | Disposition: A | Discharge status: Home / Self Care | Payer: Self-pay | Attending: Emergency Medicine | Admitting: Emergency Medicine

## 2023-10-15 DIAGNOSIS — Z3A11 11 weeks gestation of pregnancy: Secondary | ICD-10-CM | POA: Insufficient documentation

## 2023-10-15 DIAGNOSIS — O4690 Antepartum hemorrhage, unspecified, unspecified trimester: Secondary | ICD-10-CM

## 2023-10-15 DIAGNOSIS — R110 Nausea: Secondary | ICD-10-CM

## 2023-10-15 DIAGNOSIS — O209 Hemorrhage in early pregnancy, unspecified: Secondary | ICD-10-CM | POA: Diagnosis present

## 2023-10-15 LAB — CBC WITH DIFFERENTIAL/PLATELET
Abs Immature Granulocytes: 0.02 K/uL (ref 0.00–0.07)
Basophils Absolute: 0 K/uL (ref 0.0–0.1)
Basophils Relative: 0 %
Eosinophils Absolute: 0.1 K/uL (ref 0.0–0.5)
Eosinophils Relative: 1 %
HCT: 30.4 % — ABNORMAL LOW (ref 36.0–46.0)
Hemoglobin: 10.6 g/dL — ABNORMAL LOW (ref 12.0–15.0)
Immature Granulocytes: 0 %
Lymphocytes Relative: 22 %
Lymphs Abs: 1.6 K/uL (ref 0.7–4.0)
MCH: 30.2 pg (ref 26.0–34.0)
MCHC: 34.9 g/dL (ref 30.0–36.0)
MCV: 86.6 fL (ref 80.0–100.0)
Monocytes Absolute: 0.4 K/uL (ref 0.1–1.0)
Monocytes Relative: 6 %
Neutro Abs: 5.3 K/uL (ref 1.7–7.7)
Neutrophils Relative %: 71 %
Platelets: 234 K/uL (ref 150–400)
RBC: 3.51 MIL/uL — ABNORMAL LOW (ref 3.87–5.11)
RDW: 12.9 % (ref 11.5–15.5)
WBC: 7.5 K/uL (ref 4.0–10.5)
nRBC: 0 % (ref 0.0–0.2)

## 2023-10-15 LAB — MATERNIT21 PLUS CORE+SCA
Fetal Fraction: 14
Monosomy X (Turner Syndrome): NOT DETECTED
Result (T21): NEGATIVE
Trisomy 13 (Patau syndrome): NEGATIVE
Trisomy 18 (Edwards syndrome): NEGATIVE
Trisomy 21 (Down syndrome): NEGATIVE
XXX (Triple X Syndrome): NOT DETECTED
XXY (Klinefelter Syndrome): NOT DETECTED
XYY (Jacobs Syndrome): NOT DETECTED

## 2023-10-15 LAB — BASIC METABOLIC PANEL WITH GFR
Anion gap: 6 (ref 5–15)
BUN: 11 mg/dL (ref 6–20)
CO2: 27 mmol/L (ref 22–32)
Calcium: 9.5 mg/dL (ref 8.9–10.3)
Chloride: 101 mmol/L (ref 98–111)
Creatinine, Ser: 0.61 mg/dL (ref 0.44–1.00)
GFR, Estimated: >60 mL/min (ref >60)
Glucose, Bld: 100 mg/dL — ABNORMAL HIGH (ref 70–99)
Potassium: 3.4 mmol/L — ABNORMAL LOW (ref 3.5–5.1)
Sodium: 134 mmol/L — ABNORMAL LOW (ref 135–145)

## 2023-10-15 LAB — HCG, QUANTITATIVE, PREGNANCY: hCG, Beta Chain, Quant, S: 128917 m[IU]/mL — ABNORMAL HIGH (ref <5)

## 2023-10-15 MED ORDER — PROMETHAZINE HCL 25 MG PO TABS: 25.0000 mg | ORAL_TABLET | Freq: Four times a day (QID) | ORAL | 1 refills | Status: DC | PRN

## 2023-10-15 NOTE — ED Triage Notes (Signed)
 Pt reports she is aprox [redacted] weeks pregnant and began having some vaginal spotting, pt denies abd cramping. Pt is seen at [facility] for her care.

## 2023-10-15 NOTE — ED Notes (Signed)
Pt is O+ per chart review

## 2023-10-16 LAB — URINALYSIS, ROUTINE W REFLEX MICROSCOPIC
Bilirubin Urine: NEGATIVE
Glucose, UA: NEGATIVE mg/dL
Hgb urine dipstick: NEGATIVE
Ketones, ur: 20 mg/dL — AB
Leukocytes,Ua: NEGATIVE
Nitrite: NEGATIVE
Protein, ur: NEGATIVE mg/dL
Specific Gravity, Urine: 1.016 (ref 1.005–1.030)
pH: 6 (ref 5.0–8.0)

## 2023-10-16 MED ORDER — ONDANSETRON 4 MG PO TBDP
4.0000 mg | ORAL_TABLET | Freq: Once | ORAL | Status: AC
  Administered 2023-10-16: 4 mg via ORAL
  Filled 2023-10-16: qty 1

## 2023-10-16 NOTE — ED Provider Notes (Signed)
 [facility] Provider Note    Event Date/Time   First MD Initiated Contact with Patient 10/15/23 2344     (approximate)   History   Vaginal Bleeding   HPI  Breanna Lawrence is a 33 y.o. female who presents to the ED for evaluation of Vaginal Bleeding   Rh positive on previous blood work.  G1 presents for evaluation of small amount of blood on toilet tissue after she wiped today.  Reports no symptoms otherwise.  No cramping, hematochezia, hematuria, dysuria, passage of clots.   Physical Exam   Triage Vital Signs: ED Triage Vitals  Encounter Vitals Group     BP 10/15/23 2028 (!) 120/94     Girls Systolic BP Percentile --      Girls Diastolic BP Percentile --      Boys Systolic BP Percentile --      Boys Diastolic BP Percentile --      Pulse Rate 10/15/23 2028 75     Resp 10/15/23 2028 20     Temp 10/15/23 2028 98 F (36.7 C)     Temp src --      SpO2 10/15/23 2028 100 %     Weight 10/15/23 2028 129 lb (58.5 kg)     Height 10/15/23 2028 5' 4 (1.626 m)     Head Circumference --      Peak Flow --      Pain Score 10/15/23 2027 0     Pain Loc --      Pain Education --      Exclude from Growth Chart --     Most recent vital signs: Vitals:   10/16/23 0014 10/16/23 0046  BP: 113/81   Pulse: 73   Resp: 18   Temp:  97.6 F (36.4 C)  SpO2: 100%     General: Awake, no distress.  CV:  Good peripheral perfusion.  Resp:  Normal effort.  Abd:  No distention.  Soft and benign MSK:  No deformity noted.  Neuro:  No focal deficits appreciated. Other:     ED Results / Procedures / Treatments   Labs (all labs ordered are listed, but only abnormal results are displayed) Labs Reviewed  CBC WITH DIFFERENTIAL/PLATELET - Abnormal; Notable for the following components:      Result Value   RBC 3.51 (*)    Hemoglobin 10.6 (*)    HCT 30.4 (*)    All other components within normal limits  BASIC METABOLIC PANEL WITH GFR - Abnormal; Notable for the  following components:   Sodium 134 (*)    Potassium 3.4 (*)    Glucose, Bld 100 (*)    All other components within normal limits  URINALYSIS, ROUTINE W REFLEX MICROSCOPIC - Abnormal; Notable for the following components:   Color, Urine YELLOW (*)    APPearance CLEAR (*)    Ketones, ur 20 (*)    All other components within normal limits  HCG, QUANTITATIVE, PREGNANCY - Abnormal; Notable for the following components:   hCG, Beta Chain, Quant, S 871,082 (*)    All other components within normal limits    EKG   RADIOLOGY Obstetric ultrasound interpreted by me without evidence of acute pathology.  IUP in place.  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 Transvaginal technique was performed to assess early pregnancy. Doppler was performed on the bilateral maternal ovaries. COMPARISON:  Obstetrical ultrasound 10/01/2023 FINDINGS: Intrauterine gestational sac: Single Yolk sac:  Visualized. Embryo:  Visualized. Cardiac Activity: Visualized. Heart Rate: 176 bpm CRL:  43 mm   11 w   1 d                  US  EDC: 05/04/2024 Subchorionic hemorrhage:  None visualized. Maternal uterus/adnexae: Bilateral ovaries are visualized and appear within normal limits. Posterior fibroid is again seen measuring 3.9 x 3.8 x 5.0 cm. There is no pelvic free fluid. Doppler interrogation of both ovaries appears within normal limits. IMPRESSION: 1. Single live intrauterine gestation measuring 11 weeks 1 day. 2. Posterior uterine fibroid measuring 5.0 cm. 3. Bilateral maternal ovaries appear within normal limits. Electronically Signed   By:  Greig Pique M.D.   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 Transvaginal technique was performed to assess early pregnancy. Doppler was performed on the bilateral maternal ovaries. COMPARISON:  Obstetrical ultrasound 10/01/2023 FINDINGS: Intrauterine gestational sac: Single Yolk sac:  Visualized. Embryo:  Visualized. Cardiac Activity: Visualized. Heart Rate: 176 bpm CRL:  43 mm   11 w   1 d                  US  EDC: 05/04/2024 Subchorionic hemorrhage:  None visualized. Maternal uterus/adnexae: Bilateral ovaries are visualized and appear within normal limits. Posterior fibroid is again seen measuring 3.9 x 3.8 x 5.0 cm. There is no pelvic free fluid. Doppler interrogation of both ovaries appears within normal limits. IMPRESSION: 1. Single live intrauterine gestation measuring 11 weeks 1 day. 2. Posterior uterine fibroid measuring 5.0 cm. 3. Bilateral maternal ovaries appear within normal limits. Electronically Signed   By: Greig Pique M.D.   On: 10/15/2023 23:34    PROCEDURES and INTERVENTIONS:  Procedures  Medications  ondansetron (ZOFRAN-ODT) disintegrating tablet 4 mg (4 mg Oral Given 10/16/23 0046)     IMPRESSION / MDM / ASSESSMENT AND PLAN / ED COURSE  I reviewed the triage vital signs and the nursing notes.  Differential diagnosis includes, but is not limited to, miscarriage, hematuria, no pathology  {Patient presents with symptoms of an acute illness or injury that is potentially life-threatening.  Generally healthy G1 presents with light vaginal spotting in the first  trimester.  Asymptomatic and looks well.  Benign exam.  Reassuring ultrasound.  Rh+ without indication for RhoGAM.  Reassuring CBC and metabolic panel.  hCG appropriately elevated.  Will obtain a UA prior to discharge to ensure no bacteria or signs of cystitis to indicate antibiotics.  Clinical Course as of 10/16/23 0057  Thu Oct 16, 2023  0057 Reassessed, doing well, urine without infectious features or bacteriuria. [DS]    Clinical Course User Index [DS] Claudene Rover, MD     FINAL CLINICAL IMPRESSION(S) / ED DIAGNOSES  Final diagnoses:  Vaginal bleeding in pregnancy     Rx / DC Orders   ED Discharge Orders     None        Note:  This document was prepared using Dragon voice recognition software and may include unintentional dictation errors.   Claudene Rover, MD 10/16/23 [phone]

## 2023-10-16 NOTE — ED Notes (Signed)
 Pt connected to VS monitoring equipment. Call light within reach. Stretcher in lowest position with wheels locked. Side rails up. Pt requesting medication for nausea at this time. Ester Sharps, MD notified.

## 2023-10-17 ENCOUNTER — Other Ambulatory Visit: Payer: Self-pay | Admitting: Family

## 2023-10-20 ENCOUNTER — Telehealth: Payer: Self-pay

## 2023-10-20 NOTE — Telephone Encounter (Signed)
 Advise given to help with constipation.

## 2023-10-20 NOTE — Telephone Encounter (Signed)
 Breanna Lawrence

## 2023-11-05 NOTE — Progress Notes (Unsigned)
    Return Prenatal Note   Subjective   33 y.o. G1P0000 at [redacted]w[redacted]d presents for this follow-up prenatal visit.  Patient is doing well. She has no new concerns today. Patient reports: Movement: Absent Contractions: Not present  Objective   Flow sheet Vitals: Pulse Rate: 74 BP: 101/73 Fetal Heart Rate (bpm): 160 Total weight gain: 7 lb 14.4 oz (3.583 kg)  General Appearance  No acute distress, well appearing, and well nourished Pulmonary   Normal work of breathing Neurologic   Alert and oriented to person, place, and time Psychiatric   Mood and affect within normal limits   Assessment/Plan   Plan  33 y.o. G1P0000 at [redacted]w[redacted]d presents for follow-up OB visit. Reviewed prenatal record including previous visit note.  Vitamin D  deficiency, unspecified Continues on daily D3  Supervision of normal pregnancy Feeling well overall. Improved nausea symptoms.  Reviewed upcoming anatomy US , ordered sent Reviewed red flag warning signs anticipatory guidance for upcoming prenatal care.        No orders of the defined types were placed in this encounter.  Return in about 4 weeks (around 12/05/2023) for routine prenatal care.   Future Appointments  Date Time Provider Department Center  12/04/2023 10:15 AM Orlean Alan HERO, FNP AMA-AMA None  12/05/2023 10:15 AM [facility]OB-AOB US  1 AOB-IMG None  12/05/2023 11:15 AM Justino Eleanor HERO, CNM AOB-AOB None    For next visit:  continue with routine prenatal care     Damien Parsley, CNM [facility]  09/05/251:04 PM

## 2023-11-07 ENCOUNTER — Encounter: Payer: Self-pay | Admitting: Certified Nurse Midwife

## 2023-11-07 ENCOUNTER — Ambulatory Visit: Payer: Self-pay | Admitting: Certified Nurse Midwife

## 2023-11-07 VITALS — BP 101/73 | HR 74 | Wt 128.9 lb

## 2023-11-07 DIAGNOSIS — Z3402 Encounter for supervision of normal first pregnancy, second trimester: Secondary | ICD-10-CM

## 2023-11-07 DIAGNOSIS — Z3A14 14 weeks gestation of pregnancy: Secondary | ICD-10-CM

## 2023-11-07 DIAGNOSIS — E559 Vitamin D deficiency, unspecified: Secondary | ICD-10-CM

## 2023-11-07 DIAGNOSIS — O99282 Endocrine, nutritional and metabolic diseases complicating pregnancy, second trimester: Secondary | ICD-10-CM

## 2023-11-07 NOTE — Assessment & Plan Note (Signed)
 Continues on daily D3

## 2023-11-07 NOTE — Patient Instructions (Signed)
 Second Trimester of Pregnancy  The second trimester of pregnancy is from week 14 through week 27. This is months 4 through 6 of pregnancy. During the second trimester: Morning sickness is less or has stopped. You may have more energy. You may feel hungry more often. At this time, your unborn baby is growing very fast. At the end of the sixth month, the unborn baby may be up to 12 inches long and weigh about 1 pounds. You will likely start to feel the baby move between 16 and 20 weeks of pregnancy. Body changes during your second trimester Your body continues to change during this time. The changes usually go away after your baby is born. Physical changes You will gain more weight. Your belly will get bigger. You may begin to get stretch marks on your hips, belly, and breasts. Your breasts will keep growing and may hurt. You may get dark spots or blotches on your face. A dark line from your belly button to the pubic area may appear. This line is called linea nigra. Your hair may grow faster and get thicker. Health changes You may have headaches. You may have heartburn. You may pee more often. You may have swollen, bulging veins (varicose veins). You may have trouble pooping (constipation), or swollen veins in the butt that can itch or get painful (hemorrhoids). You may have back pain. This is caused by: Weight gain. Pregnancy hormones that are relaxing the joints in your pelvis. Follow these instructions at home: Medicines Talk to your health care provider if you're taking medicines. Ask if the medicines are safe to take during pregnancy. Your provider may change the medicines that you take. Do not take any medicines unless told to by your provider. Take a prenatal vitamin that has at least 600 micrograms (mcg) of folic acid. Do not use herbal medicines, illegal drugs, or medicines that are not approved by your provider. Eating and drinking While you're pregnant your body needs  extra food for your growing baby. Talk with your provider about what to eat while pregnant. Activity Most women are able to exercise during pregnancy. Exercises may need to change as your pregnancy goes on. Talk to your provider about your activities and exercise routines. Relieving pain and discomfort Wear a good, supportive bra if your breasts hurt. Rest with your legs raised if you have leg cramps or low back pain. Take warm sitz baths to soothe pain from hemorrhoids. Use hemorrhoid cream if your provider says it's okay. Do not douche. Do not use tampons or scented pads. Do not use hot tubs, steam rooms, or saunas. Safety Wear your seatbelt at all times when you're in a car. Talk to your provider if someone hits you, hurts you, or yells at you. Talk with your provider if you're feeling sad or have thoughts of hurting yourself. Lifestyle Certain things can be harmful while you're pregnant. It's best to avoid the following: Do not drink alcohol,smoke, vape, or use products with nicotine or tobacco in them. If you need help quitting, talk with your provider. Avoid cat litter boxes and soil used by cats. These things carry germs that can cause harm to your pregnancy and your baby. General instructions Keep all follow-up visits. It helps you and your unborn baby stay as healthy as possible. Write down your questions. Take them to your prenatal visits. Your provider will: Talk with you about your overall health. Give you advice or refer you to specialists who can help with different needs,  including: Prenatal education classes. Mental health and counseling. Foods and healthy eating. Ask for help if you need help with food. Where to find more information American Pregnancy Association: americanpregnancy.org [employer]: acog.org Office on [employer] Health: [employer] Contact a health care provider if: You have a headache that does not go away  when you take medicine. You have any of these problems: You can't eat or drink. You throw up or feel like you may throw up. You have watery poop (diarrhea) for 2 days or more. You have pain when you pee or your pee smells bad. You have been sick for 2 days or more and are not getting better. Contact your provider right away if: You have any of these coming from your vagina: Abnormal discharge. Bad-smelling fluid. Bleeding. Your baby is moving less than usual. You have contractions, belly cramping, or have pain in your pelvis or lower back. You have symptoms of high blood pressure or preeclampsia. These include: A severe, throbbing headache that does not go away. Sudden or extreme swelling of your face, hands, legs, or feet. Vision problems: You see spots. You have blurry vision. Your eyes are sensitive to light. If you can't reach the provider, go to an urgent care or emergency room. Get help right away if: You faint, become confused, or can't think clearly. You have chest pain or trouble breathing. You have any kind of injury, such as from a fall or a car crash. These symptoms may be an emergency. Call 911 right away. Do not wait to see if the symptoms will go away. Do not drive yourself to the hospital. This information is not intended to replace advice given to you by your health care provider. Make sure you discuss any questions you have with your health care provider. Document Revised: 11/21/2022 Document Reviewed: 06/21/2022 Elsevier Patient Education  2024 [employer].

## 2023-11-07 NOTE — Assessment & Plan Note (Signed)
 Feeling well overall. Improved nausea symptoms.  Reviewed upcoming anatomy US , ordered sent Reviewed red flag warning signs anticipatory guidance for upcoming prenatal care.

## 2023-11-21 ENCOUNTER — Encounter: Payer: Self-pay | Admitting: Obstetrics

## 2023-11-21 ENCOUNTER — Emergency Department: Payer: Self-pay

## 2023-11-21 ENCOUNTER — Other Ambulatory Visit: Payer: Self-pay

## 2023-11-21 ENCOUNTER — Emergency Department
Admission: EM | Admit: 2023-11-21 | Discharge: 2023-11-21 | Disposition: A | Discharge status: Home / Self Care | Payer: Self-pay

## 2023-11-21 ENCOUNTER — Ambulatory Visit (INDEPENDENT_AMBULATORY_CARE_PROVIDER_SITE_OTHER): Payer: Self-pay | Admitting: Obstetrics

## 2023-11-21 VITALS — BP 100/69 | HR 86 | Wt 131.7 lb

## 2023-11-21 DIAGNOSIS — O99612 Diseases of the digestive system complicating pregnancy, second trimester: Secondary | ICD-10-CM | POA: Insufficient documentation

## 2023-11-21 DIAGNOSIS — Z3A16 16 weeks gestation of pregnancy: Secondary | ICD-10-CM

## 2023-11-21 DIAGNOSIS — K5900 Constipation, unspecified: Secondary | ICD-10-CM | POA: Insufficient documentation

## 2023-11-21 DIAGNOSIS — O26892 Other specified pregnancy related conditions, second trimester: Secondary | ICD-10-CM | POA: Diagnosis present

## 2023-11-21 DIAGNOSIS — R1032 Left lower quadrant pain: Secondary | ICD-10-CM | POA: Insufficient documentation

## 2023-11-21 LAB — CBC
HCT: 30.1 % — ABNORMAL LOW (ref 36.0–46.0)
Hemoglobin: 10.4 g/dL — ABNORMAL LOW (ref 12.0–15.0)
MCH: 29.7 pg (ref 26.0–34.0)
MCHC: 34.6 g/dL (ref 30.0–36.0)
MCV: 86 fL (ref 80.0–100.0)
Platelets: 229 K/uL (ref 150–400)
RBC: 3.5 MIL/uL — ABNORMAL LOW (ref 3.87–5.11)
RDW: 12.8 % (ref 11.5–15.5)
WBC: 7.1 K/uL (ref 4.0–10.5)
nRBC: 0 % (ref 0.0–0.2)

## 2023-11-21 LAB — URINALYSIS, ROUTINE W REFLEX MICROSCOPIC
Bacteria, UA: NONE SEEN
Bilirubin Urine: NEGATIVE
Glucose, UA: NEGATIVE mg/dL
Hgb urine dipstick: NEGATIVE
Ketones, ur: NEGATIVE mg/dL
Nitrite: NEGATIVE
Protein, ur: NEGATIVE mg/dL
Specific Gravity, Urine: 1.017 (ref 1.005–1.030)
Squamous Epithelial / HPF: >50 /HPF (ref 0–5)
pH: 6 (ref 5.0–8.0)

## 2023-11-21 LAB — COMPREHENSIVE METABOLIC PANEL WITH GFR
ALT: 14 U/L (ref 0–44)
AST: 19 U/L (ref 15–41)
Albumin: 3.4 g/dL — ABNORMAL LOW (ref 3.5–5.0)
Alkaline Phosphatase: 43 U/L (ref 38–126)
Anion gap: 13 (ref 5–15)
BUN: 6 mg/dL (ref 6–20)
CO2: 20 mmol/L — ABNORMAL LOW (ref 22–32)
Calcium: 9.4 mg/dL (ref 8.9–10.3)
Chloride: 104 mmol/L (ref 98–111)
Creatinine, Ser: 0.48 mg/dL (ref 0.44–1.00)
GFR, Estimated: >60 mL/min (ref >60)
Glucose, Bld: 97 mg/dL (ref 70–99)
Potassium: 3.9 mmol/L (ref 3.5–5.1)
Sodium: 137 mmol/L (ref 135–145)
Total Bilirubin: 0.4 mg/dL (ref 0.0–1.2)
Total Protein: 6.6 g/dL (ref 6.5–8.1)

## 2023-11-21 LAB — POC URINE PREG, ED: Preg Test, Ur: POSITIVE — AB

## 2023-11-21 MED ORDER — ACETAMINOPHEN 500 MG PO TABS
1000.0000 mg | ORAL_TABLET | Freq: Once | ORAL | Status: AC
  Administered 2023-11-21: 1000 mg via ORAL
  Filled 2023-11-21: qty 2

## 2023-11-21 NOTE — Progress Notes (Signed)
    Return Prenatal Note   Subjective  33 y.o. G1P0000 at [redacted]w[redacted]d presents for an acute visit for LLQ pain, previously had last ROB 2wks ago.   Patient with her mother-in-law, states she has been having a sharp stabbing pain on her left side and wanted to be seen today. Seen in ED 8/13 for spotting, US  showing posterior fibroid, 5.0cm otherwise reassuring. She does not have regular BM daily, pregnancy has worsened constipation, tries to stay hydrated, but doesn't take fiber or Miralax. Last BM 2-3 days ago, needs to strain significantly, and stools are hard. Also reports occasional dysuria, UA running in-office.   Denies vaginal bleeding or leaking fluid. Objective  Flow sheet Vitals: Pulse Rate: 86 BP: 100/69 Total weight gain: 10 lb 11.2 oz (4.853 kg)  General Appearance  No acute distress, well appearing, and well nourished Pulmonary   Normal work of breathing Abdominal   LLQ tenderness, able to palpate stool burden. Neurologic   Alert and oriented to person, place, and time Psychiatric   Mood and affect within normal limits   Assessment/Plan   Plan  33 y.o. G1P0000 at [redacted]w[redacted]d presents for an acute visit. Reviewed prenatal record including previous visit note.  1. Constipation, unspecified constipation type (Primary)  2. LLQ pain -Large stool burden in LLQ, suspect pt's pain is from constipation, which she suffered from prior to pregnancy.  -Miralax protocol reviewed in depth with patient and mother -Work note given thru 9/21, pt commits to starting today and continuing this weekend.  -Follow up for ROB as scheduled -ED precautions for abdominal pain given  Return in about 2 weeks (around 12/05/2023) for as scheduled for ultrasound and ROB.   Future Appointments  Date Time Provider Department Center  12/04/2023 10:15 AM Orlean Alan HERO, FNP AMA-AMA None  12/05/2023 10:15 AM AOB-AOB US  1 AOB-IMG None  12/05/2023 11:15 AM Justino Eleanor HERO, CNM AOB-AOB None    For next  visit:  continue with routine prenatal care    Estil Mangle, DO [facility]

## 2023-11-21 NOTE — Discharge Instructions (Signed)
 Your evaluation in the emergency department is overall reassuring.  I agree with your OB/GYN provider that your abdominal discomfort is likely due to constipation, and I recommend you continue with the MiraLAX that they prescribed you earlier today.  Continue to drink plenty of water as well.  You can use Tylenol  as needed for any ongoing discomfort.  Please follow-up closely with your OB/GYN provider for reevaluation and return to the emergency department with any new or worsening symptoms including worsening pain, fever, or any other symptoms concerning to you.

## 2023-11-21 NOTE — Patient Instructions (Addendum)
-  Mix 1 capful of Miralax into drink of choice (Apple juice, Gatorade, Powerade, Water)  -Drink 1 capful every hour.  -Repeat this every hour until bowel movements are clear (or the color of the beverage you are drinking) -Once bowel movements are clear, then stop Miralax for 24 hours.  -After 24 hours, then start 1-2 capfuls of Miralax every day to achieve daily, soft bowel movements. May decrease dose if having diarrhea, but should still continue some amount of Miralax daily.

## 2023-11-21 NOTE — ED Triage Notes (Signed)
 Patient states abdominal pain since yesterday, last BM two days ago; reports she is [redacted] weeks pregnant. Denies N/V/D and urinary symptoms.

## 2023-11-21 NOTE — ED Provider Notes (Signed)
 [facility] Provider Note    Event Date/Time   First MD Initiated Contact with Patient 11/21/23 1627     (approximate)   History   Abdominal Pain  Patient states abdominal pain since yesterday, last BM two days ago; reports she is [redacted] weeks pregnant. Denies N/V/D and urinary symptoms.    HPI Breanna Lawrence is a 33 y.o. female G1, P0 at 16 weeks 2 days presents for evaluation of abdominal discomfort -Per chart review, was seen in OB/GYN clinic earlier today.  Noted to have constipation and left lower quadrant abdominal discomfort, reassuring exam.  Recommended MiraLAX.  ED return precautions provided. - Patient states the left lower quadrant discomfort has worsened since she was sent home from clinic.  Did take some MiraLAX but has not yet had a bowel movement.  Did have bowel movement about 2 days ago. - No fever.  Does endorse some urinary frequency though no dysuria no hematuria.  No history of nephrolithiasis. - Has not yet taken anything for pain - No vaginal bleeding or discharge  Per chart review, patient was seen in our emergency department on 10/15/2023 for vaginal bleeding in pregnancy.  Noted to be Rh+ from previous blood work.  OB ultrasound at that time reassuring, does note posterior uterine fibroid.      Physical Exam   Triage Vital Signs: ED Triage Vitals  Encounter Vitals Group     BP 11/21/23 1515 103/74     Girls Systolic BP Percentile --      Girls Diastolic BP Percentile --      Boys Systolic BP Percentile --      Boys Diastolic BP Percentile --      Pulse Rate 11/21/23 1515 82     Resp 11/21/23 1515 20     Temp 11/21/23 1515 97.8 F (36.6 C)     Temp Source 11/21/23 1515 Oral     SpO2 11/21/23 1515 99 %     Weight 11/21/23 1514 131 lb (59.4 kg)     Height 11/21/23 1514 5' 4 (1.626 m)     Head Circumference --      Peak Flow --      Pain Score 11/21/23 1514 9     Pain Loc --      Pain Education --      Exclude from  Growth Chart --     Most recent vital signs: Vitals:   11/21/23 1515  BP: 103/74  Pulse: 82  Resp: 20  Temp: 97.8 F (36.6 C)  SpO2: 99%     General: Awake, no distress.  CV:  Good peripheral perfusion. RRR, RP 2+ Resp:  Normal effort. CTAB Abd:  No distention.Moderate tenderness to palpation in left lower quadrant and left flank.  Nonperitoneal exam.  No tenderness elsewhere throughout abdomen.    ED Results / Procedures / Treatments   Labs (all labs ordered are listed, but only abnormal results are displayed) Labs Reviewed  COMPREHENSIVE METABOLIC PANEL WITH GFR - Abnormal; Notable for the following components:      Result Value   CO2 20 (*)    Albumin 3.4 (*)    All other components within normal limits  CBC - Abnormal; Notable for the following components:   RBC 3.50 (*)    Hemoglobin 10.4 (*)    HCT 30.1 (*)    All other components within normal limits  URINALYSIS, ROUTINE W REFLEX MICROSCOPIC - Abnormal; Notable for the following components:  Color, Urine YELLOW (*)    APPearance CLOUDY (*)    Leukocytes,Ua MODERATE (*)    All other components within normal limits  POC URINE PREG, ED - Abnormal; Notable for the following components:   Preg Test, Ur Positive (*)    All other components within normal limits  URINE CULTURE     EKG  N/a   RADIOLOGY Radiology interpreted by myself and radiology reports reviewed.  No acute pathology identified.    PROCEDURES:  Critical Care performed: No  Procedures   MEDICATIONS ORDERED IN ED: Medications  acetaminophen  (TYLENOL ) tablet 1,000 mg (1,000 mg Oral Given 11/21/23 1746)     IMPRESSION / MDM / ASSESSMENT AND PLAN / ED COURSE  I reviewed the triage vital signs and the nursing notes.                              DDX/MDM/AP: Differential diagnosis includes, but is not limited to, pain secondary to constipation, doubt heterotopic pregnancy, consider urolithiasis, UTI/pyelonephritis, less likely  diverticulitis in this 33 year old female, consider but doubt ovarian torsion.  Plan: - Labs - OB ultrasound, renal ultrasound - Tylenol  - N.p.o. - Reevaluate  Patient's presentation is most consistent with acute presentation with potential threat to life or bodily function.  The patient is on the cardiac monitor to evaluate for evidence of arrhythmia and/or significant heart rate changes.  ED course below.   Clinical Course as of 11/21/23 1919  Fri Nov 21, 2023  1704 CBC with mild stable anemia, no leukocytosis [MM]  1705 CMP reviewed, unremarkable [MM]  1705 Urinalysis reviewed, equivocal -- 6010 wbs, contaminated, mod LE, neg nitrites [MM]  1838 US  OB: IMPRESSION: 1. Single live intrauterine gestation. 2. No acute abnormality. 3. Posterior uterine fibroid. 4. Left ovary appears within normal limits. Right ovary not visualized.  This exam is performed on an emergent basis and does not comprehensively evaluate fetal size, dating, or anatomy; follow-up complete OB US  should be considered if further fetal assessment is warranted.   [MM]  1838 Renal US : FINDINGS: Right Kidney:  Renal measurements: 11.3 x 4.4 x 4.5 cm = volume: 115.1 mL. Echogenicity within normal limits. No mass or hydronephrosis visualized. No evidence of nephrolithiasis.  Left Kidney:  Renal measurements: 10.0 x 6.0 x 4.7 cm = volume: 146.8 mL. Echogenicity within normal limits. No mass. Mild pelviectasis without frank hydronephrosis. No nephrolithiasis.  Bladder:  Bladder is decompressed with a Foley catheter.  Other:  None.  IMPRESSION: 1. Mild left renal pelviectasis without frank hydronephrosis. 2. Otherwise unremarkable renal ultrasound.   [MM]  1915 Patient reevaluated, abdominal discomfort notably improved improved.  Reassured by unremarkable workup.  Repeat abdominal exam very reassuring with only trace tenderness in left lower quadrant.  No clear evidence of acute pathology at  this time and patient is very well-appearing here.  I suspect she likely has pain secondary constipation.  Was prescribed MiraLAX earlier today which I recommend she continue with.  Rechecked temperature, no fever.  No clear evidence of UTI and patient reports that she had a negative urinalysis earlier today at her OB clinic --I do not see this in her chart, suspect POC testing.  I did add urine culture today out of abundance of caution, can follow as outpatient, no indication for empiric antibiotics at this time, clinically doubt pyelonephritis and again urine sample is grossly contaminated.  Plan for postop patient follow-up with her OB/GYN provider.  ED  return precautions in place.  Patient agrees with plan. [MM]    Clinical Course User Index [MM] Clarine Ozell LABOR, MD     FINAL CLINICAL IMPRESSION(S) / ED DIAGNOSES   Final diagnoses:  Abdominal pain in pregnancy, second trimester  Constipation, unspecified constipation type     Rx / DC Orders   ED Discharge Orders     None        Note:  This document was prepared using Dragon voice recognition software and may include unintentional dictation errors.   Clarine Ozell LABOR, MD 11/21/23 RENARD

## 2023-11-21 NOTE — ED Notes (Signed)
 Patient denies any vaginal bleeding. Patient saw her OB today and said the pain has become worse. Patient denies dysuria.

## 2023-11-23 LAB — URINE CULTURE

## 2023-12-03 NOTE — Progress Notes (Deleted)
    Return Prenatal Note   Assessment/Plan   Plan  33 y.o. G1P0000 at [redacted]w[redacted]d presents for follow-up OB visit. Reviewed prenatal record including previous visit note.  No problem-specific Assessment & Plan notes found for this encounter.    No orders of the defined types were placed in this encounter.  No follow-ups on file.   Future Appointments  Date Time Provider Department Center  12/04/2023 10:15 AM Orlean Alan HERO, FNP AMA-AMA None  12/05/2023 10:15 AM AOB-AOB US  1 AOB-IMG None  12/05/2023 11:15 AM Justino Eleanor HERO, CNM AOB-AOB None    For next visit:  {Return Prenatal Care:31737}    Subjective       Objective   Flow sheet Vitals:   Total weight gain: 10 lb 11.2 oz (4.853 kg)  General Appearance  No acute distress, well appearing, and well nourished Pulmonary   Normal work of breathing Neurologic   Alert and oriented to person, place, and time Psychiatric   Mood and affect within normal limits  Eleanor Justino, CNM 12/03/23 2:31 PM

## 2023-12-04 ENCOUNTER — Ambulatory Visit: Payer: Self-pay | Admitting: Family

## 2023-12-05 ENCOUNTER — Other Ambulatory Visit: Payer: Self-pay

## 2023-12-05 ENCOUNTER — Encounter: Payer: Self-pay | Admitting: Obstetrics

## 2023-12-05 DIAGNOSIS — Z3400 Encounter for supervision of normal first pregnancy, unspecified trimester: Secondary | ICD-10-CM

## 2023-12-05 DIAGNOSIS — Z3A18 18 weeks gestation of pregnancy: Secondary | ICD-10-CM

## 2023-12-08 ENCOUNTER — Telehealth: Payer: Self-pay

## 2023-12-08 NOTE — Telephone Encounter (Signed)
 Patient called with concerns of rash on her face. She thinks that she may have the shingles. I recommended her to go to UC for evaluation to r/o shingles. I advised patient that she would be considered contagious to other women that were pregnant if she was to be seen here. She verbalized understanding and said she would go to [facility] for further evaluation.

## 2023-12-08 NOTE — Progress Notes (Signed)
 Subjective Patient ID: Breanna Lawrence is a 33 y.o. female.    Patient is a pleasant 33 year old female present to the clinic today with reports of facial itching, that started between eyes, and progressed across nose as well as to bilateral buccal for approximately 1 to 2 weeks.  Patient denies any new facial creams nor make-up.  Patient also denies any significant rash.  Patient also reporting right foot pain on the plantar surface since earlier today, 12/08/2023.  Patient does report being [redacted] weeks gestation with no other significant medical history.   History provided by:  Patient Language interpreter used: No     Review of Systems  Constitutional: Negative.   HENT: Negative.    Eyes: Negative.   Respiratory: Negative.    Cardiovascular: Negative.   Gastrointestinal: Negative.   Endocrine: Negative.   Genitourinary: Negative.   Musculoskeletal: Negative.   Allergic/Immunologic: Negative.   Neurological: Negative.   Hematological: Negative.   Psychiatric/Behavioral: Negative.      Patient History  Allergies: No Known Allergies  History reviewed. No pertinent past medical history. History reviewed. No pertinent surgical history. Social History   Socioeconomic History  . Marital status: Not on file    Spouse name: Not on file  . Number of children: Not on file  . Years of education: Not on file  . Highest education level: Not on file  Occupational History  . Not on file  Tobacco Use  . Smoking status: Never  . Smokeless tobacco: Never  Substance and Sexual Activity  . Alcohol use: Not on file  . Drug use: Not on file  . Sexual activity: Not on file  Other Topics Concern  . Not on file  Social History Narrative  . Not on file   No family history on file. Current Outpatient Medications on File Prior to Visit  Medication Sig Dispense Refill  . ergocalciferol  (Vitamin D2) 1.25 MG (50000 UT) capsule Take 50,000 Units by mouth.    . Prenatal Vit-Fe Fumarate-FA  (Prenatal Vitamin Plus Low Iron ) 27-1 MG tablet Take 1 tablet by mouth 1 (one) time each day.    . promethazine  (Phenergan ) 25 MG tablet Take 25 mg by mouth.     No current facility-administered medications on file prior to visit.    Objective  Vitals:   12/08/23 1835  BP: 110/78  Pulse: 99  Resp: 19  Temp: 36.8 C (98.2 F)  TempSrc: Tympanic  SpO2: 97%  Weight: 62.7 kg  Height: 5' 4  PainSc: 0-No pain        OBGYN/Pregnancy Status: Pregnant      No results found.  Physical Exam Constitutional:      Appearance: Normal appearance. She is normal weight.  HENT:     Head: Normocephalic.      Comments: Patient with equivocal erythema to mid forehead, bilateral buccal, bridge of nose.  No vesicular pattern, no tracking, no crusting, nor any erythema.  No periorbital edema.    Right Ear: Hearing, tympanic membrane, ear canal and external ear normal.     Left Ear: Hearing, tympanic membrane, ear canal and external ear normal.     Nose: Nose normal.     Right Turbinates: Not enlarged, swollen or pale.     Left Turbinates: Not enlarged, swollen or pale.     Right Sinus: No maxillary sinus tenderness or frontal sinus tenderness.     Left Sinus: No maxillary sinus tenderness or frontal sinus tenderness.     Mouth/Throat:  Lips: Pink.     Mouth: Mucous membranes are moist.     Pharynx: Oropharynx is clear.  Eyes:     Extraocular Movements: Extraocular movements intact.     Conjunctiva/sclera: Conjunctivae normal.  Cardiovascular:     Rate and Rhythm: Normal rate and regular rhythm.     Pulses: Normal pulses.     Heart sounds: Normal heart sounds.  Pulmonary:     Effort: Pulmonary effort is normal.     Breath sounds: Normal breath sounds.  Abdominal:     General: Abdomen is flat. Bowel sounds are normal.     Palpations: Abdomen is soft.  Musculoskeletal:        General: Normal range of motion.     Cervical back: Normal range of motion and neck supple.        Feet:     Comments: Patient with tenderness overlying tissue of plantar portion of right foot.  No bone point tenderness with ability to supinate and pronate, as well as dorsiflex.  Negative Morton's.  No involvement to Achilles.  Patient able to ambulate without issue.  Skin:    General: Skin is warm and dry.     Capillary Refill: Capillary refill takes less than 2 seconds.  Neurological:     General: No focal deficit present.     Mental Status: She is alert and oriented to person, place, and time. Mental status is at baseline.  Psychiatric:        Mood and Affect: Mood normal.        Behavior: Behavior is cooperative.        Thought Content: Thought content normal.        Judgment: Judgment normal.     No results found for this visit on 12/08/23.     Procedures MDM:     1 Acute complicated illness or injury     Explanation of Medical Decision Making and variances from expected care:  Patient is a very pleasant 33 year old female presented to the clinic today with reports of itching to face as well as plantar surface right foot pain.  It is felt likely patient is experiencing dermatitis to face rather than infectious or autoimmune driven process.  This being said, I do recommend she follow with her OB/GYN, as well as dermatology should this continue.  I will prescribe hydrocortisone 2.5% to be used twice daily, and do recommend avoiding any scented, dyed or other substances to face.  Patient does understand this.  She should also continue to monitor for appearance of rash, or facial edema.  Patient did not appear to have bone point tenderness, on foot, and this is thought to reflect plantar fasciitis.  She does report starting her job today, and has been on her feet most of the day, and does attribute some of her discomfort to this.  I also recommend patient continue monitoring this discomfort, follow with PCP for this as well.   Assessment requiring historian other than patient: No      Independent visualization of image, tracing, or test: No     Discussion of management with another provider: No     Risk:: Low            Assessment/Plan Diagnoses and all orders for this visit:  Plantar fasciitis of right foot  Dermatitis -     Hydrocortisone, Perianal, (Anusol-HC) 2.5 % cream; Apply 1 Application topically in the morning and 1 Application in the evening.     Disposition Status:  Home  There are no Patient Instructions on file for this visit.  Progress note signed by Fonda Flavors, NP on 12/08/23 at  7:04 PM

## 2023-12-14 ENCOUNTER — Other Ambulatory Visit: Payer: Self-pay

## 2023-12-14 ENCOUNTER — Encounter: Payer: Self-pay | Admitting: Intensive Care

## 2023-12-14 ENCOUNTER — Emergency Department

## 2023-12-14 ENCOUNTER — Emergency Department
Admission: EM | Admit: 2023-12-14 | Discharge: 2023-12-14 | Disposition: A | Discharge status: Home / Self Care | Attending: Emergency Medicine | Admitting: Emergency Medicine

## 2023-12-14 DIAGNOSIS — Z3A19 19 weeks gestation of pregnancy: Secondary | ICD-10-CM | POA: Diagnosis not present

## 2023-12-14 DIAGNOSIS — O26892 Other specified pregnancy related conditions, second trimester: Secondary | ICD-10-CM | POA: Insufficient documentation

## 2023-12-14 DIAGNOSIS — R1011 Right upper quadrant pain: Secondary | ICD-10-CM | POA: Insufficient documentation

## 2023-12-14 DIAGNOSIS — R1031 Right lower quadrant pain: Secondary | ICD-10-CM | POA: Diagnosis not present

## 2023-12-14 DIAGNOSIS — O26899 Other specified pregnancy related conditions, unspecified trimester: Secondary | ICD-10-CM

## 2023-12-14 LAB — CBC WITH DIFFERENTIAL/PLATELET
Abs Immature Granulocytes: 0.03 K/uL (ref 0.00–0.07)
Basophils Absolute: 0 K/uL (ref 0.0–0.1)
Basophils Relative: 0 %
Eosinophils Absolute: 0.1 K/uL (ref 0.0–0.5)
Eosinophils Relative: 1 %
HCT: 31 % — ABNORMAL LOW (ref 36.0–46.0)
Hemoglobin: 10.8 g/dL — ABNORMAL LOW (ref 12.0–15.0)
Immature Granulocytes: 0 %
Lymphocytes Relative: 12 %
Lymphs Abs: 0.9 K/uL (ref 0.7–4.0)
MCH: 30.1 pg (ref 26.0–34.0)
MCHC: 34.8 g/dL (ref 30.0–36.0)
MCV: 86.4 fL (ref 80.0–100.0)
Monocytes Absolute: 0.4 K/uL (ref 0.1–1.0)
Monocytes Relative: 5 %
Neutro Abs: 6.3 K/uL (ref 1.7–7.7)
Neutrophils Relative %: 82 %
Platelets: 224 K/uL (ref 150–400)
RBC: 3.59 MIL/uL — ABNORMAL LOW (ref 3.87–5.11)
RDW: 12.9 % (ref 11.5–15.5)
WBC: 7.7 K/uL (ref 4.0–10.5)
nRBC: 0 % (ref 0.0–0.2)

## 2023-12-14 LAB — URINALYSIS, ROUTINE W REFLEX MICROSCOPIC
Bilirubin Urine: NEGATIVE
Glucose, UA: NEGATIVE mg/dL
Hgb urine dipstick: NEGATIVE
Ketones, ur: NEGATIVE mg/dL
Leukocytes,Ua: NEGATIVE
Nitrite: NEGATIVE
Protein, ur: NEGATIVE mg/dL
Specific Gravity, Urine: 1.011 (ref 1.005–1.030)
pH: 7 (ref 5.0–8.0)

## 2023-12-14 LAB — COMPREHENSIVE METABOLIC PANEL WITH GFR
ALT: 13 U/L (ref 0–44)
AST: 21 U/L (ref 15–41)
Albumin: 3.4 g/dL — ABNORMAL LOW (ref 3.5–5.0)
Alkaline Phosphatase: 52 U/L (ref 38–126)
Anion gap: 9 (ref 5–15)
BUN: <5 mg/dL — ABNORMAL LOW (ref 6–20)
CO2: 22 mmol/L (ref 22–32)
Calcium: 8.8 mg/dL — ABNORMAL LOW (ref 8.9–10.3)
Chloride: 103 mmol/L (ref 98–111)
Creatinine, Ser: 0.54 mg/dL (ref 0.44–1.00)
GFR, Estimated: >60 mL/min (ref >60)
Glucose, Bld: 85 mg/dL (ref 70–99)
Potassium: 3.8 mmol/L (ref 3.5–5.1)
Sodium: 134 mmol/L — ABNORMAL LOW (ref 135–145)
Total Bilirubin: 0.4 mg/dL (ref 0.0–1.2)
Total Protein: 6.7 g/dL (ref 6.5–8.1)

## 2023-12-14 LAB — ABO/RH: ABO/RH(D): O POS

## 2023-12-14 MED ORDER — ACETAMINOPHEN 500 MG PO TABS
1000.0000 mg | ORAL_TABLET | Freq: Once | ORAL | Status: AC
  Administered 2023-12-14: 1000 mg via ORAL
  Filled 2023-12-14: qty 2

## 2023-12-14 NOTE — ED Provider Notes (Signed)
 [facility] Provider Note    Event Date/Time   First MD Initiated Contact with Patient 12/14/23 1248     (approximate)   History   Abdominal Pain   HPI  Breanna Lawrence is a 33 y.o. female  who presents to the emergency department today because of concern for lower abdominal pain. Started last night after eating. Located in bilateral lower quadrants. Did have one episode of emesis earlier today. The patient denies any fevers. She was having issues with constipation recently in this pregnancy but states that her bowel movements have been mor regular since she has been taking medication.     Physical Exam   Triage Vital Signs: ED Triage Vitals  Encounter Vitals Group     BP 12/14/23 1234 104/76     Girls Systolic BP Percentile --      Girls Diastolic BP Percentile --      Boys Systolic BP Percentile --      Boys Diastolic BP Percentile --      Pulse Rate 12/14/23 1234 75     Resp 12/14/23 1234 18     Temp 12/14/23 1234 97.7 F (36.5 C)     Temp Source 12/14/23 1234 Oral     SpO2 12/14/23 1234 97 %     Weight 12/14/23 1241 140 lb (63.5 kg)     Height 12/14/23 1241 5' 4 (1.626 m)     Head Circumference --      Peak Flow --      Pain Score 12/14/23 1241 8     Pain Loc --      Pain Education --      Exclude from Growth Chart --     Most recent vital signs: Vitals:   12/14/23 1234  BP: 104/76  Pulse: 75  Resp: 18  Temp: 97.7 F (36.5 C)  SpO2: 97%   General: Awake, alert, oriented. CV:  Good peripheral perfusion. Regular rate and rhythm. Resp:  Normal effort. Lungs clear. Abd:  Gravid. No tenderness to palpation.   ED Results / Procedures / Treatments   Labs (all labs ordered are listed, but only abnormal results are displayed) Labs Reviewed  CBC WITH DIFFERENTIAL/PLATELET - Abnormal; Notable for the following components:      Result Value   RBC 3.59 (*)    Hemoglobin 10.8 (*)    HCT 31.0 (*)    All other components within  normal limits  COMPREHENSIVE METABOLIC PANEL WITH GFR - Abnormal; Notable for the following components:   Sodium 134 (*)    BUN <5 (*)    Calcium 8.8 (*)    Albumin 3.4 (*)    All other components within normal limits  URINALYSIS, ROUTINE W REFLEX MICROSCOPIC - Abnormal; Notable for the following components:   Color, Urine YELLOW (*)    APPearance HAZY (*)    All other components within normal limits  ABO/RH     EKG  None   RADIOLOGY I independently interpreted and visualized the US  Ob limited. My interpretation: single live intrauterine pregnancy Radiology interpretation:  IMPRESSION:  Single, viable intrauterine pregnancy at approximately 19 weeks and  2 days gestation by ultrasound evaluation.    PROCEDURES:  Critical Care performed: No    MEDICATIONS ORDERED IN ED: Medications - No data to display   IMPRESSION / MDM / ASSESSMENT AND PLAN / ED COURSE  I reviewed the triage vital signs and the nursing notes.  Differential diagnosis includes, but is not limited to, UTI, gastroenteritis, pain secondary to pregnancy, appendicitis  Patient's presentation is most consistent with acute presentation with potential threat to life or bodily function.   Patient was noted to the emergency department today because of concerns for lower abdominal pain in the setting of being [redacted] weeks pregnant.  On exam patient without any abdominal tenderness.  Blood work without concerning leukocytosis.  Does show slight anemia.  No concerning electrolyte abnormalities.  UA without findings concerning for infection.  At this time I think likely pain secondary to pregnancy.  I did discuss anemia with patient.  She has had issues with significant constipation so I am hesitant to prescribe iron  at this time.  Did asked that patient discuss with her OB/GYN to discuss risk benefits.    FINAL CLINICAL IMPRESSION(S) / ED DIAGNOSES   Final diagnoses:  Abdominal pain  during pregnancy, antepartum       Note:  This document was prepared using Dragon voice recognition software and may include unintentional dictation errors.    Floy Roberts, MD 12/14/23 [phone]

## 2023-12-14 NOTE — ED Triage Notes (Signed)
 Patient presents with lower pelvic pain that radiates around to back. Denies discharge. Reports she is [redacted] weeks pregnant

## 2023-12-15 ENCOUNTER — Ambulatory Visit (INDEPENDENT_AMBULATORY_CARE_PROVIDER_SITE_OTHER): Admitting: Certified Nurse Midwife

## 2023-12-15 ENCOUNTER — Emergency Department
Admission: EM | Admit: 2023-12-15 | Discharge: 2023-12-15 | Disposition: A | Discharge status: Home / Self Care | Attending: Emergency Medicine | Admitting: Emergency Medicine

## 2023-12-15 ENCOUNTER — Encounter: Payer: Self-pay | Admitting: Certified Nurse Midwife

## 2023-12-15 ENCOUNTER — Other Ambulatory Visit (HOSPITAL_COMMUNITY)
Admission: RE | Admit: 2023-12-15 | Discharge: 2023-12-15 | Disposition: A | Discharge status: Home / Self Care | Source: Ambulatory Visit | Attending: Certified Nurse Midwife | Admitting: Certified Nurse Midwife

## 2023-12-15 ENCOUNTER — Emergency Department

## 2023-12-15 VITALS — BP 101/65 | HR 94 | Wt 135.1 lb

## 2023-12-15 DIAGNOSIS — Z3A19 19 weeks gestation of pregnancy: Secondary | ICD-10-CM | POA: Insufficient documentation

## 2023-12-15 DIAGNOSIS — Z113 Encounter for screening for infections with a predominantly sexual mode of transmission: Secondary | ICD-10-CM

## 2023-12-15 DIAGNOSIS — R1030 Lower abdominal pain, unspecified: Secondary | ICD-10-CM | POA: Insufficient documentation

## 2023-12-15 DIAGNOSIS — R102 Pelvic and perineal pain unspecified side: Secondary | ICD-10-CM

## 2023-12-15 DIAGNOSIS — O26892 Other specified pregnancy related conditions, second trimester: Secondary | ICD-10-CM | POA: Diagnosis present

## 2023-12-15 DIAGNOSIS — Z3400 Encounter for supervision of normal first pregnancy, unspecified trimester: Secondary | ICD-10-CM

## 2023-12-15 LAB — CBC WITH DIFFERENTIAL/PLATELET
Abs Immature Granulocytes: 0.02 K/uL (ref 0.00–0.07)
Basophils Absolute: 0 K/uL (ref 0.0–0.1)
Basophils Relative: 0 %
Eosinophils Absolute: 0.1 K/uL (ref 0.0–0.5)
Eosinophils Relative: 1 %
HCT: 32.9 % — ABNORMAL LOW (ref 36.0–46.0)
Hemoglobin: 11.2 g/dL — ABNORMAL LOW (ref 12.0–15.0)
Immature Granulocytes: 0 %
Lymphocytes Relative: 14 %
Lymphs Abs: 1.2 K/uL (ref 0.7–4.0)
MCH: 29.6 pg (ref 26.0–34.0)
MCHC: 34 g/dL (ref 30.0–36.0)
MCV: 86.8 fL (ref 80.0–100.0)
Monocytes Absolute: 0.4 K/uL (ref 0.1–1.0)
Monocytes Relative: 5 %
Neutro Abs: 6.4 K/uL (ref 1.7–7.7)
Neutrophils Relative %: 80 %
Platelets: 228 K/uL (ref 150–400)
RBC: 3.79 MIL/uL — ABNORMAL LOW (ref 3.87–5.11)
RDW: 12.7 % (ref 11.5–15.5)
WBC: 8 K/uL (ref 4.0–10.5)
nRBC: 0 % (ref 0.0–0.2)

## 2023-12-15 LAB — COMPREHENSIVE METABOLIC PANEL WITH GFR
ALT: 14 U/L (ref 0–44)
AST: 21 U/L (ref 15–41)
Albumin: 3.5 g/dL (ref 3.5–5.0)
Alkaline Phosphatase: 62 U/L (ref 38–126)
Anion gap: 7 (ref 5–15)
BUN: 6 mg/dL (ref 6–20)
CO2: 23 mmol/L (ref 22–32)
Calcium: 8.9 mg/dL (ref 8.9–10.3)
Chloride: 104 mmol/L (ref 98–111)
Creatinine, Ser: 0.58 mg/dL (ref 0.44–1.00)
GFR, Estimated: >60 mL/min (ref >60)
Glucose, Bld: 84 mg/dL (ref 70–99)
Potassium: 3.7 mmol/L (ref 3.5–5.1)
Sodium: 134 mmol/L — ABNORMAL LOW (ref 135–145)
Total Bilirubin: 0.5 mg/dL (ref 0.0–1.2)
Total Protein: 7.1 g/dL (ref 6.5–8.1)

## 2023-12-15 LAB — URINALYSIS, ROUTINE W REFLEX MICROSCOPIC
Bilirubin Urine: NEGATIVE
Glucose, UA: NEGATIVE mg/dL
Hgb urine dipstick: NEGATIVE
Ketones, ur: 5 mg/dL — AB
Leukocytes,Ua: NEGATIVE
Nitrite: NEGATIVE
Protein, ur: NEGATIVE mg/dL
Specific Gravity, Urine: 1.009 (ref 1.005–1.030)
pH: 7 (ref 5.0–8.0)

## 2023-12-15 LAB — LIPASE, BLOOD: Lipase: 23 U/L (ref 11–51)

## 2023-12-15 MED ORDER — OXYCODONE HCL 5 MG PO TABS: 2.5000 mg | ORAL_TABLET | Freq: Three times a day (TID) | ORAL | 0 refills | Status: DC | PRN

## 2023-12-15 MED ORDER — ACETAMINOPHEN 500 MG PO TABS
1000.0000 mg | ORAL_TABLET | Freq: Once | ORAL | Status: AC
  Administered 2023-12-15: 1000 mg via ORAL
  Filled 2023-12-15: qty 2

## 2023-12-15 MED ORDER — CYCLOBENZAPRINE HCL 10 MG PO TABS: 10.0000 mg | ORAL_TABLET | Freq: Three times a day (TID) | ORAL | 2 refills | Status: DC | PRN

## 2023-12-15 MED ORDER — OXYCODONE HCL 5 MG PO TABS
5.0000 mg | ORAL_TABLET | Freq: Once | ORAL | Status: AC
  Administered 2023-12-15: 5 mg via ORAL
  Filled 2023-12-15: qty 1

## 2023-12-15 NOTE — ED Notes (Signed)
 First Nurse Note:  L & D called.  Patient currently 19 weeks, 5 days pregnant with lower abdominal pain.  Patient to be seen in ED first.

## 2023-12-15 NOTE — ED Provider Notes (Signed)
 [facility] Provider Note    Event Date/Time   First MD Initiated Contact with Patient 12/15/23 [phone]     (approximate)   History   Abdominal Pain   HPI  Breanna Lawrence is a 33 y.o. female who presents to the ED for evaluation of Abdominal Pain   I reviewed ED visit from about 12 hours ago where patient was seen for similar lower abdominal pain in the setting of [redacted] weeks gestation.  G1.  Blood work and UA reassuring, ultrasound with IUP without concerning features.  Patient returns to the ED alongside her mother for evaluation of severe lower abdominal pain.  She reports pain has progressively worsened, no appetite.  No emesis or fevers.  No vaginal bleeding or loss of fluids.   Physical Exam   Triage Vital Signs: ED Triage Vitals  Encounter Vitals Group     BP 12/15/23 0325 99/75     Girls Systolic BP Percentile --      Girls Diastolic BP Percentile --      Boys Systolic BP Percentile --      Boys Diastolic BP Percentile --      Pulse Rate 12/15/23 0325 84     Resp 12/15/23 0325 16     Temp 12/15/23 0325 97.6 F (36.4 C)     Temp Source 12/15/23 0325 Oral     SpO2 12/15/23 0325 97 %     Weight 12/15/23 0326 140 lb (63.5 kg)     Height 12/15/23 0326 5' 4 (1.626 m)     Head Circumference --      Peak Flow --      Pain Score 12/15/23 0326 10     Pain Loc --      Pain Education --      Exclude from Growth Chart --     Most recent vital signs: Vitals:   12/15/23 0445 12/15/23 0448  BP: 105/77   Pulse: 73   Resp:  19  Temp:  97.8 F (36.6 C)  SpO2: 100%     General: Awake.  Doubled over in pain, laying on her left side and obviously uncomfortable. CV:  Good peripheral perfusion.  Resp:  Normal effort.  Abd:  No distention.  Diffuse and poorly localizing lower abdominal tenderness MSK:  No deformity noted.  Neuro:  No focal deficits appreciated. Other:     ED Results / Procedures / Treatments   Labs (all labs ordered are  listed, but only abnormal results are displayed) Labs Reviewed  CBC WITH DIFFERENTIAL/PLATELET - Abnormal; Notable for the following components:      Result Value   RBC 3.79 (*)    Hemoglobin 11.2 (*)    HCT 32.9 (*)    All other components within normal limits  COMPREHENSIVE METABOLIC PANEL WITH GFR - Abnormal; Notable for the following components:   Sodium 134 (*)    All other components within normal limits  LIPASE, BLOOD  URINALYSIS, ROUTINE W REFLEX MICROSCOPIC    EKG   RADIOLOGY   Official radiology report(s): MR ABDOMEN WO CONTRAST Result Date: 12/15/2023 CLINICAL DATA:  Lower abdominal pain. Patient complains of  lower left abdomen pain that radiates to her back. Symptoms started Saturday however got worse today. Patient was seen earlier for same thing. EXAM: MRI ABDOMEN AND PELVIS WITHOUT CONTRAST TECHNIQUE: Multiplanar multisequence MR imaging of the abdomen and pelvis was performed. No intravenous contrast was administered. COMPARISON:  None Available. FINDINGS: COMBINED FINDINGS FOR  BOTH MR ABDOMEN AND PELVIS Lower chest: No acute findings. Hepatobiliary: 15 x 9 mm subcapsular T1 hypointense, T2 hyperintense lesion identified in the posterior right liver without restricted diffusion. There is no evidence for gallstones, gallbladder wall thickening, or pericholecystic fluid. No intrahepatic or extrahepatic biliary dilation. Pancreas: No focal mass lesion. No dilatation of the main duct. No intraparenchymal cyst. No peripancreatic edema. Spleen:  No splenomegaly. No suspicious focal mass lesion. Adrenals/Urinary Tract: No adrenal nodule or mass. Left kidney unremarkable. 2 mm subcapsular hyperintensity is too small to characterize but statistically most likely a benign cyst. Minimal fullness noted intrarenal collecting systems without hydronephrosis or evidence of hydroureter. Bladder is nondistended. Stomach/Bowel: Stomach is unremarkable. No gastric wall thickening. No evidence  of outlet obstruction. Duodenum is normally positioned as is the ligament of Treitz. No small bowel dilatation. Neither the terminal ileum nor the appendix are discretely visible, but there is no suggestion of edema or inflammation in the pericecal right lower quadrant. No substantial colonic dilatation. Vascular/Lymphatic: No abdominal aortic aneurysm. No abdominal lymphadenopathy No pelvic sidewall lymphadenopathy. Reproductive: Single intrauterine gestation evident. 4.6 x 5.4 x 4.3 cm left uterine fibroid evident. No adnexal mass. Other: Trace free fluid noted in the cul-de-sac. Musculoskeletal: No suspicious marrow signal abnormality. IMPRESSION: 1. No acute findings in the abdomen or pelvis. 2. 15 x 9 mm subcapsular T1 hypointense, T2 hyperintense lesion in the posterior right liver without restricted diffusion. This is incompletely characterized on today's study but is likely a benign lesion such as a hemangioma. 3. 4.6 x 5.4 x 4.3 cm left uterine fibroid. 4. Trace free fluid in the cul-de-sac. Electronically Signed   By: Camellia Candle M.D.   On: 12/15/2023 05:19   MR PELVIS WO CONTRAST Result Date: 12/15/2023 CLINICAL DATA:  Lower abdominal pain. Patient complains of  lower left abdomen pain that radiates to her back. Symptoms started Saturday however got worse today. Patient was seen earlier for same thing. EXAM: MRI ABDOMEN AND PELVIS WITHOUT CONTRAST TECHNIQUE: Multiplanar multisequence MR imaging of the abdomen and pelvis was performed. No intravenous contrast was administered. COMPARISON:  None Available. FINDINGS: COMBINED FINDINGS FOR BOTH MR ABDOMEN AND PELVIS Lower chest: No acute findings. Hepatobiliary: 15 x 9 mm subcapsular T1 hypointense, T2 hyperintense lesion identified in the posterior right liver without restricted diffusion. There is no evidence for gallstones, gallbladder wall thickening, or pericholecystic fluid. No intrahepatic or extrahepatic biliary dilation. Pancreas: No focal  mass lesion. No dilatation of the main duct. No intraparenchymal cyst. No peripancreatic edema. Spleen:  No splenomegaly. No suspicious focal mass lesion. Adrenals/Urinary Tract: No adrenal nodule or mass. Left kidney unremarkable. 2 mm subcapsular hyperintensity is too small to characterize but statistically most likely a benign cyst. Minimal fullness noted intrarenal collecting systems without hydronephrosis or evidence of hydroureter. Bladder is nondistended. Stomach/Bowel: Stomach is unremarkable. No gastric wall thickening. No evidence of outlet obstruction. Duodenum is normally positioned as is the ligament of Treitz. No small bowel dilatation. Neither the terminal ileum nor the appendix are discretely visible, but there is no suggestion of edema or inflammation in the pericecal right lower quadrant. No substantial colonic dilatation. Vascular/Lymphatic: No abdominal aortic aneurysm. No abdominal lymphadenopathy No pelvic sidewall lymphadenopathy. Reproductive: Single intrauterine gestation evident. 4.6 x 5.4 x 4.3 cm left uterine fibroid evident. No adnexal mass. Other: Trace free fluid noted in the cul-de-sac. Musculoskeletal: No suspicious marrow signal abnormality. IMPRESSION: 1. No acute findings in the abdomen or pelvis. 2. 15 x 9 mm subcapsular T1  hypointense, T2 hyperintense lesion in the posterior right liver without restricted diffusion. This is incompletely characterized on today's study but is likely a benign lesion such as a hemangioma. 3. 4.6 x 5.4 x 4.3 cm left uterine fibroid. 4. Trace free fluid in the cul-de-sac. Electronically Signed   By: Camellia Candle M.D.   On: 12/15/2023 05:19   US  OB Limited Result Date: 12/14/2023 CLINICAL DATA:  Pelvic pain radiating to the back. EXAM: LIMITED OBSTETRIC ULTRASOUND COMPARISON:  November 21, 2023 FINDINGS: Number of Fetuses: 1 Heart Rate:  160 bpm Movement: Yes Presentation: Breech Placental Location: Posterior Previa: No Amniotic Fluid  (Subjective):  Within normal limits. AFI: N/A cm BPD: 4.38 cm 19 w  2 d MATERNAL FINDINGS: Cervix:  Appears closed. Uterus/Adnexae: A 5.4 cm x 4.4 cm x 4.9 cm heterogeneous uterine fibroid is noted. IMPRESSION: Single, viable intrauterine pregnancy at approximately 19 weeks and 2 days gestation by ultrasound evaluation. This exam is performed on an emergent basis and does not comprehensively evaluate fetal size, dating, or anatomy; follow-up complete OB US  should be considered if further fetal assessment is warranted. Electronically Signed   By: Suzen Dials M.D.   On: 12/14/2023 13:43    PROCEDURES and INTERVENTIONS:  Procedures  Medications  acetaminophen  (TYLENOL ) tablet 1,000 mg (1,000 mg Oral Given 12/15/23 0440)  oxyCODONE (Oxy IR/ROXICODONE) immediate release tablet 5 mg (5 mg Oral Given 12/15/23 0440)     IMPRESSION / MDM / ASSESSMENT AND PLAN / ED COURSE  I reviewed the triage vital signs and the nursing notes.  Differential diagnosis includes, but is not limited to, fibroid uterus, acute appendicitis, miscarriage, placental abruption, diverticulitis, SBO  {Patient presents with symptoms of an acute illness or injury that is potentially life-threatening.  G1 at 19 weeks presents with severely worsening abdominal pain, without evidence of acute pathology and suitable for continued outpatient management.  Lower abdominal tenderness is present.  Blood work remains benign.  Recent ultrasound did not elucidate any concerning features beyond her noted fibroid uterus so after discussing with the patient we obtain MRIs to evaluate for appendicitis or other derangements, and these are reassuring as above.  Patient is connected with OB/GYN and we discussed follow-up with them and appropriate ED return precautions.  Clinical Course as of 12/15/23 0553  Mon Dec 15, 2023  0541 Reassessed, much more comfortable.  We discussed MRI results without acute features beyond the known fibroid uterus.   We discussed fibroid uterus, constipation and managing pain, OB/GYN follow-up. [DS]    Clinical Course User Index [DS] Claudene Rover, MD     FINAL CLINICAL IMPRESSION(S) / ED DIAGNOSES   Final diagnoses:  Lower abdominal pain     Rx / DC Orders   ED Discharge Orders     None        Note:  This document was prepared using Dragon voice recognition software and may include unintentional dictation errors.   Claudene Rover, MD 12/15/23 [phone]

## 2023-12-15 NOTE — Assessment & Plan Note (Signed)
 Note given to Redd for work. 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 Reviewed red flag warning signs anticipatory guidance for upcoming prenatal care.

## 2023-12-15 NOTE — ED Triage Notes (Signed)
Patient is [redacted] weeks pregnant

## 2023-12-15 NOTE — ED Notes (Signed)
 Patient transported to MRI

## 2023-12-15 NOTE — Discharge Instructions (Signed)
 Use Tylenol  for pain and fevers.  Up to 1000 mg per dose, up to 4 times per day.  Do not take more than 4000 mg of Tylenol /acetaminophen  within 24 hours..  More aggressive dosing of Miralax for constipation  Follow up with OBGYN  If you develop fevers, vaginal bleeding, inability to keep anything down or any other concerns and please return to the ED.

## 2023-12-15 NOTE — ED Triage Notes (Signed)
 Patient brought in by POV with c/o lower left abdomen pain that radiates to her back. Symptoms started Saturday however got worse today. Patient was seen earlier for same thing. Denies any UTI symptoms, or trauma.

## 2023-12-15 NOTE — Progress Notes (Signed)
    Return Prenatal Note   Subjective   33 y.o. G1P0000 at [redacted]w[redacted]d presents for this follow-up prenatal visit.  Patient continues to have lower pelvic pain and back pain. She was evaluated at the ED yesterday and today.   Patient reports: Movement: Absent Contractions: Not present  Objective   Flow sheet Vitals: Pulse Rate: 94 BP: 101/65 Fetal Heart Rate (bpm): 150 Total weight gain: 14 lb 1.6 oz (6.396 kg)  General Appearance  No acute distress, well appearing, and well nourished Pulmonary   Normal work of breathing Neurologic   Alert and oriented to person, place, and time Psychiatric   Mood and affect within normal limits   Assessment/Plan   Plan  33 y.o. G1P0000 at [redacted]w[redacted]d presents for follow-up OB visit. Reviewed prenatal record including previous visit note.  Pelvic pain Redd was seem yesterday and overnight for severe pelvic pain that she describes as worse in the suprapubic area but wrapping around her back. She had an US  and MRI could not show an origin for her pain. She has some relief when given oxy but it was short lived. She has been afebrile but endorses nausea and vomiting. She has also had some diarrhea today. She has been taking Miralax and milk of magnesia for constipation.  Wet prep was negative in clinic. Swab sent to lab. UA neg in ED. No bleeding or LOF. Vagina tender with collection for wet prep.  Rx given for flexeril  to see if this is musculoskeletal in nature. Reviewed precautions when taking.  Advised to stop constipation meds for 48 hours to see if there is a GI virus or aggravation from medications.  Advised to return to ED with worsening pain or fever.    Supervision of normal pregnancy Note given to Redd for work. 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 Reviewed red flag warning signs anticipatory guidance for upcoming prenatal care.        No orders of the defined types were  placed in this encounter.  No follow-ups on file.   Future Appointments  Date Time Provider Department Center  12/26/2023  8:15 AM [facility] US  1 AOB-IMG None  01/13/2024  4:15 PM Charma Domino, CNM [facility] None    For next visit:  continue with routine prenatal care     Damien Parsley, CNM [facility] 10/13/254:07 PM

## 2023-12-15 NOTE — Assessment & Plan Note (Addendum)
 Breanna Lawrence was seem yesterday and overnight for severe pelvic pain that she describes as worse in the suprapubic area but wrapping around her back. She had an US  and MRI could not show an origin for her pain. She has some relief when given oxy but it was short lived. She has been afebrile but endorses nausea and vomiting. She has also had some diarrhea today. She has been taking Miralax and milk of magnesia for constipation.  Wet prep was negative in clinic. Swab sent to lab. UA neg in ED. No bleeding or LOF. Vagina tender with collection for wet prep.  Rx given for flexeril  to see if this is musculoskeletal in nature. Reviewed precautions when taking.  Advised to stop constipation meds for 48 hours to see if there is a GI virus or aggravation from medications.  Advised to return to ED with worsening pain or fever.

## 2023-12-17 ENCOUNTER — Ambulatory Visit: Payer: Self-pay | Admitting: Certified Nurse Midwife

## 2023-12-17 ENCOUNTER — Other Ambulatory Visit: Payer: Self-pay | Admitting: Certified Nurse Midwife

## 2023-12-17 LAB — CERVICOVAGINAL ANCILLARY ONLY
Bacterial Vaginitis (gardnerella): POSITIVE — AB
Candida Glabrata: NEGATIVE
Candida Vaginitis: NEGATIVE
Chlamydia: NEGATIVE
Comment: NEGATIVE
Comment: NEGATIVE
Comment: NEGATIVE
Comment: NEGATIVE
Comment: NEGATIVE
Comment: NORMAL
Neisseria Gonorrhea: NEGATIVE
Trichomonas: NEGATIVE

## 2023-12-17 MED ORDER — METRONIDAZOLE 500 MG PO TABS: 500.0000 mg | ORAL_TABLET | Freq: Two times a day (BID) | ORAL | 0 refills | Status: AC

## 2023-12-25 ENCOUNTER — Other Ambulatory Visit: Payer: Self-pay

## 2023-12-25 DIAGNOSIS — Z3689 Encounter for other specified antenatal screening: Secondary | ICD-10-CM

## 2023-12-26 ENCOUNTER — Other Ambulatory Visit: Payer: Self-pay

## 2023-12-26 DIAGNOSIS — Z3689 Encounter for other specified antenatal screening: Secondary | ICD-10-CM

## 2023-12-26 DIAGNOSIS — Z3A21 21 weeks gestation of pregnancy: Secondary | ICD-10-CM | POA: Diagnosis not present

## 2023-12-31 ENCOUNTER — Telehealth: Payer: Self-pay

## 2023-12-31 DIAGNOSIS — B9689 Other specified bacterial agents as the cause of diseases classified elsewhere: Secondary | ICD-10-CM

## 2023-12-31 MED ORDER — METRONIDAZOLE 500 MG PO TABS: 500.0000 mg | ORAL_TABLET | Freq: Two times a day (BID) | ORAL | 0 refills | Status: DC

## 2023-12-31 NOTE — Telephone Encounter (Signed)
 Pt calling triage reporting she was not notified about her labs from 12/17/23. She states she just seen the message in her MY Chart. The pharmacy has already put the RX back.I will resend RX,

## 2024-01-09 ENCOUNTER — Other Ambulatory Visit: Payer: Self-pay

## 2024-01-09 DIAGNOSIS — Z362 Encounter for other antenatal screening follow-up: Secondary | ICD-10-CM

## 2024-01-09 NOTE — Progress Notes (Signed)
 U/S F/u orders placed per 12/26/23 anatomy scan.

## 2024-01-12 NOTE — Progress Notes (Unsigned)
    Return Prenatal Note   Subjective   33 y.o. G1P0000 at [redacted]w[redacted]d presents for this follow-up prenatal visit.  Patient had a follow up anatomy scan today due to inadequate cardiac views at last visit.  Patient reports: she completed abx tx for BV. She would like lab results to be called to her. We reviewed her bloodwork form 10/12. She is taking a daily PNV, but it does not have iron  in it. 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  Movement: Present Contractions: Not present  Objective   Flow sheet Vitals: Pulse Rate: 86 BP: 105/77 Fetal Heart Rate (bpm): 163 Total weight gain: 21 lb 12.8 oz (9.888 kg)  General Appearance  No acute distress, well appearing, and well nourished Pulmonary   Normal work of breathing Neurologic   Alert and oriented to person, place, and time Psychiatric   Mood and affect within normal limits   Assessment/Plan   Plan  33 y.o. G1P0000 at [redacted]w[redacted]d presents for follow-up OB visit. Reviewed prenatal record including previous visit note.  Sickle cell trait Discussed mild anemia likely consistent with sickle cell trait. Encouraged daily PNV w/ fe. Check iron  indices with 28 wk labs  Fibroid uterus Has a posterior fibroid near her cervix. It has grown somewhat in size. Last imaged at 20 wks. 4.8 x 4.0 x 4.5 cm.  Desires elective primary c-section. Does not want to have a trial of labor with fibroid near her cervix.  Will plan f/u imaging around 32 wks to re-evaluate size. We discussed the possibility that the fetus may make it difficult to adequately evaluate the fibroid, especially given that it's posterior. Plan consult with MD in third trimester to discuss delivery planning. Discussed typically do planned primary csection deliveries around 39 weeks.      Orders Placed This Encounter  Procedures   28 Week RH+Panel   Fe+TIBC+Fer   No follow-ups on file.   Future Appointments  Date Time Provider Department Center  02/04/2024 10:15 AM Charma Domino, CNM  [facility] None     For next visit:  ROB with 1 hour glucola, third trimester labs, and Tdap     Domino Charma, CNM  01/13/24 4:47 PM

## 2024-01-13 ENCOUNTER — Ambulatory Visit (INDEPENDENT_AMBULATORY_CARE_PROVIDER_SITE_OTHER)

## 2024-01-13 ENCOUNTER — Ambulatory Visit (INDEPENDENT_AMBULATORY_CARE_PROVIDER_SITE_OTHER): Admitting: Registered Nurse

## 2024-01-13 VITALS — BP 105/77 | HR 86 | Wt 142.8 lb

## 2024-01-13 DIAGNOSIS — Z3A23 23 weeks gestation of pregnancy: Secondary | ICD-10-CM

## 2024-01-13 DIAGNOSIS — D259 Leiomyoma of uterus, unspecified: Secondary | ICD-10-CM | POA: Insufficient documentation

## 2024-01-13 DIAGNOSIS — Z362 Encounter for other antenatal screening follow-up: Secondary | ICD-10-CM | POA: Diagnosis not present

## 2024-01-13 DIAGNOSIS — D573 Sickle-cell trait: Secondary | ICD-10-CM | POA: Insufficient documentation

## 2024-01-13 DIAGNOSIS — Z3492 Encounter for supervision of normal pregnancy, unspecified, second trimester: Secondary | ICD-10-CM

## 2024-01-13 NOTE — Assessment & Plan Note (Signed)
 Has a posterior fibroid near her cervix. It has grown somewhat in size. Last imaged at 20 wks. 4.8 x 4.0 x 4.5 cm.  Desires elective primary c-section. Does not want to have a trial of labor with fibroid near her cervix.  Will plan f/u imaging around 32 wks to re-evaluate size. We discussed the possibility that the fetus may make it difficult to adequately evaluate the fibroid, especially given that it's posterior. Plan consult with MD in third trimester to discuss delivery planning. Discussed typically do planned primary csection deliveries around 39 weeks.

## 2024-01-13 NOTE — Assessment & Plan Note (Signed)
 Discussed mild anemia likely consistent with sickle cell trait. Encouraged daily PNV w/ fe. Check iron  indices with 28 wk labs

## 2024-02-04 ENCOUNTER — Encounter: Admitting: Registered Nurse

## 2024-02-04 DIAGNOSIS — Z3400 Encounter for supervision of normal first pregnancy, unspecified trimester: Secondary | ICD-10-CM

## 2024-02-04 DIAGNOSIS — Z3A26 26 weeks gestation of pregnancy: Secondary | ICD-10-CM

## 2024-02-05 ENCOUNTER — Other Ambulatory Visit: Payer: Self-pay

## 2024-02-05 DIAGNOSIS — J069 Acute upper respiratory infection, unspecified: Secondary | ICD-10-CM | POA: Insufficient documentation

## 2024-02-05 DIAGNOSIS — O26892 Other specified pregnancy related conditions, second trimester: Secondary | ICD-10-CM | POA: Insufficient documentation

## 2024-02-05 DIAGNOSIS — Z3A27 27 weeks gestation of pregnancy: Secondary | ICD-10-CM | POA: Insufficient documentation

## 2024-02-05 DIAGNOSIS — O99512 Diseases of the respiratory system complicating pregnancy, second trimester: Secondary | ICD-10-CM | POA: Insufficient documentation

## 2024-02-05 NOTE — ED Triage Notes (Signed)
 Pt reports nasal congestion and cough that started today.

## 2024-02-06 ENCOUNTER — Emergency Department
Admission: EM | Admit: 2024-02-06 | Discharge: 2024-02-06 | Disposition: A | Discharge status: Home / Self Care | Attending: Emergency Medicine | Admitting: Emergency Medicine

## 2024-02-06 DIAGNOSIS — J069 Acute upper respiratory infection, unspecified: Secondary | ICD-10-CM

## 2024-02-06 LAB — RESP PANEL BY RT-PCR (RSV, FLU A&B, COVID)  RVPGX2
Influenza A by PCR: NEGATIVE
Influenza B by PCR: NEGATIVE
Resp Syncytial Virus by PCR: NEGATIVE
SARS Coronavirus 2 by RT PCR: NEGATIVE

## 2024-02-06 MED ORDER — ACETAMINOPHEN 325 MG PO TABS
650.0000 mg | ORAL_TABLET | Freq: Once | ORAL | Status: AC
  Administered 2024-02-06: 650 mg via ORAL
  Filled 2024-02-06: qty 2

## 2024-02-06 NOTE — ED Provider Notes (Signed)
 [facility] Provider Note    Event Date/Time   First MD Initiated Contact with Patient 02/06/24 [phone]     (approximate)   History   Nasal Congestion   HPI  Breanna Lawrence is a 33 y.o. female   Past medical history of no significant past medical history here approximately [redacted] weeks pregnant thus far uncomplicated pregnancy with 1 day of subjective fever nasal congestion and cough.  Mild sore throat as well.  No known sick contacts.  No GI or GU symptoms.   External Medical Documents Reviewed: Previous OB notes and OB ultrasound from last month      Physical Exam   Triage Vital Signs: ED Triage Vitals  Encounter Vitals Group     BP 02/05/24 2151 104/67     Girls Systolic BP Percentile --      Girls Diastolic BP Percentile --      Boys Systolic BP Percentile --      Boys Diastolic BP Percentile --      Pulse Rate 02/05/24 2150 83     Resp 02/05/24 2150 18     Temp 02/05/24 2150 98.3 F (36.8 C)     Temp Source 02/05/24 2150 Oral     SpO2 02/05/24 2150 97 %     Weight --      Height --      Head Circumference --      Peak Flow --      Pain Score 02/05/24 2150 0     Pain Loc --      Pain Education --      Exclude from Growth Chart --     Most recent vital signs: Vitals:   02/05/24 2150 02/05/24 2151  BP:  104/67  Pulse: 83   Resp: 18   Temp: 98.3 F (36.8 C)   SpO2: 97%     General: Awake, no distress.  CV:  Good peripheral perfusion.  Resp:  Normal effort.  Abd:  No distention.  Other:  Pleasant young woman in no acute distress with normal vital signs and afebrile.  Neck supple full range of motion posterior oropharynx without lesions or exudates or erythema, clear lungs to all lung fields, skin appears warm and well-perfused, TMs appear noninfected bilaterally.   ED Results / Procedures / Treatments   Labs (all labs ordered are listed, but only abnormal results are displayed) Labs Reviewed  RESP PANEL BY RT-PCR (RSV, FLU  A&B, COVID)  [id number]     I ordered and reviewed the above labs they are notable for negative viral testing    PROCEDURES:  Critical Care performed: No  Procedures   MEDICATIONS ORDERED IN ED: Medications  acetaminophen  (TYLENOL ) tablet 650 mg (650 mg Oral Given 02/06/24 0121)     IMPRESSION / MDM / ASSESSMENT AND PLAN / ED COURSE  I reviewed the triage vital signs and the nursing notes.                                Patient's presentation is most consistent with acute presentation with potential threat to life or bodily function.  Differential diagnosis includes, but is not limited to, viral URI, bacterial pneumonia, acute otitis media, viral pharyngitis, strep pharyngitis, deep space neck infection or abscess, sepsis or meningitis considered but less    MDM:    Viral URI symptoms is 33 year old who is [redacted] weeks pregnant.  No abdominal  or pelvic symptoms to suggest any complication of the pregnancy, symptoms most consistent with viral URI and no evidence of bacterial pneumonia on lung auscultation, nor sources of bacterial infection on examination of the ears, no suggestive on examination of the throat.  Anticipatory guidance given, appropriate for discharge and close PMD/gynecology follow-up.      FINAL CLINICAL IMPRESSION(S) / ED DIAGNOSES   Final diagnoses:  Viral URI with cough     Rx / DC Orders   ED Discharge Orders     None        Note:  This document was prepared using Dragon voice recognition software and may include unintentional dictation errors.    Cyrena Mylar, MD 02/06/24 [phone]

## 2024-02-06 NOTE — Discharge Instructions (Signed)
 Take Tylenol  650 mg every 6 hours as needed for aches/pains/fever.  Thank you for choosing us  for your health care today!  Please see your primary doctor this week for a follow up appointment.   If you have any new, worsening, or unexpected symptoms call your doctor right away or come back to the emergency department for reevaluation.  It was my pleasure to care for you today.   Ginnie EDISON Cyrena, MD

## 2024-02-13 ENCOUNTER — Other Ambulatory Visit

## 2024-02-13 ENCOUNTER — Ambulatory Visit (INDEPENDENT_AMBULATORY_CARE_PROVIDER_SITE_OTHER): Admitting: Registered Nurse

## 2024-02-13 VITALS — BP 107/72 | HR 89 | Wt 152.7 lb

## 2024-02-13 DIAGNOSIS — O3413 Maternal care for benign tumor of corpus uteri, third trimester: Secondary | ICD-10-CM

## 2024-02-13 DIAGNOSIS — Z3400 Encounter for supervision of normal first pregnancy, unspecified trimester: Secondary | ICD-10-CM

## 2024-02-13 DIAGNOSIS — D259 Leiomyoma of uterus, unspecified: Secondary | ICD-10-CM

## 2024-02-13 DIAGNOSIS — U071 COVID-19: Secondary | ICD-10-CM | POA: Insufficient documentation

## 2024-02-13 DIAGNOSIS — Z3A28 28 weeks gestation of pregnancy: Secondary | ICD-10-CM

## 2024-02-13 NOTE — Assessment & Plan Note (Signed)
 Has a posterior fibroid near her cervix. It has grown somewhat in size. Last imaged at 20 wks. 4.8 x 4.0 x 4.5 cm.  Desires elective primary c-section. Does not want to have a trial of labor with fibroid near her cervix.  Will plan f/u imaging next visit to re-evaluate size. We discussed the possibility that the fetus may make it difficult to adequately evaluate the fibroid, especially given that it's posterior. Plan consult with MD in third trimester to discuss delivery planning. Discussed typically do planned primary csection deliveries around 39 weeks.

## 2024-02-13 NOTE — Progress Notes (Signed)
° ° °  Return Prenatal Note   Subjective   33 y.o. G1P0000 at [redacted]w[redacted]d presents for this follow-up prenatal visit. Was seen in ER 12/5 for viral URI sx and was diagnosed with COVID. Third trimester labs today. Patient is doing well. Patient reports: Movement: Present Contractions: Not present  Objective   Flow sheet Vitals: Pulse Rate: 89 BP: 107/72 Fundal Height: 28 cm Fetal Heart Rate (bpm): 160 Total weight gain: 14.4 kg  General Appearance  No acute distress, well appearing, and well nourished Pulmonary   Normal work of breathing Neurologic   Alert and oriented to person, place, and time Psychiatric   Mood and affect within normal limits   Assessment/Plan   Plan  33 y.o. G1P0000 at [redacted]w[redacted]d presents for follow-up OB visit. Reviewed prenatal record including previous visit note.  Fibroid uterus Has a posterior fibroid near her cervix. It has grown somewhat in size. Last imaged at 20 wks. 4.8 x 4.0 x 4.5 cm.  Desires elective primary c-section. Does not want to have a trial of labor with fibroid near her cervix.  Will plan f/u imaging next visit to re-evaluate size. We discussed the possibility that the fetus may make it difficult to adequately evaluate the fibroid, especially given that it's posterior. Plan consult with MD in third trimester to discuss delivery planning. Discussed typically do planned primary csection deliveries around 39 weeks.      Orders Placed This Encounter  Procedures   US  OB Comp + 14 Wk    Standing Status:   Future    Expected Date:   02/27/2024    Expiration Date:   02/12/2025    Reason for Exam (SYMPTOM  OR DIAGNOSIS REQUIRED):   f/u growth, size of uterine fibroid    Preferred Imaging Location?:   Internal   No follow-ups on file.   Future Appointments  Date Time Provider Department Center  03/01/2024  9:30 AM [facility]-A[facility] US  1 AOB-IMG None  03/01/2024 10:15 AM Charma Domino, CNM AOB-A[facility] None    For next visit:  continue with routine  prenatal care. Should have one of her next visits with Dr. Leigh or Starla to discuss c-section logistics.     Domino Charma, CNM  02/13/2024 4:12 PM

## 2024-02-14 ENCOUNTER — Ambulatory Visit: Payer: Self-pay | Admitting: Registered Nurse

## 2024-02-14 LAB — 28 WEEK RH+PANEL
Basophils Absolute: 0 x10E3/uL (ref 0.0–0.2)
Basos: 0 %
EOS (ABSOLUTE): 0 x10E3/uL (ref 0.0–0.4)
Eos: 1 %
Gestational Diabetes Screen: 92 mg/dL (ref 70–139)
HIV Screen 4th Generation wRfx: NONREACTIVE
Hematocrit: 30.3 % — ABNORMAL LOW (ref 34.0–46.6)
Hemoglobin: 9.7 g/dL — ABNORMAL LOW (ref 11.1–15.9)
Immature Grans (Abs): 0.1 x10E3/uL (ref 0.0–0.1)
Immature Granulocytes: 1 %
Lymphocytes Absolute: 1.2 x10E3/uL (ref 0.7–3.1)
Lymphs: 17 %
MCH: 28.7 pg (ref 26.6–33.0)
MCHC: 32 g/dL (ref 31.5–35.7)
MCV: 90 fL (ref 79–97)
Monocytes Absolute: 0.3 x10E3/uL (ref 0.1–0.9)
Monocytes: 4 %
Neutrophils Absolute: 5.4 x10E3/uL (ref 1.4–7.0)
Neutrophils: 77 %
Platelets: 229 x10E3/uL (ref 150–450)
RBC: 3.38 x10E6/uL — ABNORMAL LOW (ref 3.77–5.28)
RDW: 14 % (ref 11.7–15.4)
RPR Ser Ql: NONREACTIVE
WBC: 7 x10E3/uL (ref 3.4–10.8)

## 2024-02-14 LAB — IRON,TIBC AND FERRITIN PANEL
Ferritin: 38 ng/mL (ref 15–150)
Iron Saturation: 10 % — ABNORMAL LOW (ref 15–55)
Iron: 41 ug/dL (ref 27–159)
Total Iron Binding Capacity: 423 ug/dL (ref 250–450)
UIBC: 382 ug/dL (ref 131–425)

## 2024-02-14 MED ORDER — ACCRUFER 30 MG PO CAPS: 30.0000 mg | ORAL_CAPSULE | ORAL | 5 refills | Status: DC

## 2024-02-17 NOTE — Telephone Encounter (Signed)
 Spoke with patient. Advised her mother is listed as an Emergency contact, but she does not have a DPR on file and therefore, we can only speak with patient until this is signed. Patient states her mom is just frustrated due to this being the second time a medication has been sent in and not at the pharmacy when they go to pick it up. She states it's not the same medication. Chart reviewed and it looks like the rx was printed and did not transmit electronically to the pharmacy. Apologized for the inconvenience. Advised we will contact the pharmacy and get this corrected. Also advised that [employer]  requires an authorization and will take several days to process. Recommended that she take OTC IRON  325mg  daily. Patient states she will check dosage of OTC she is currently taking.

## 2024-02-17 NOTE — Telephone Encounter (Signed)
 TRIAGE VOICEMAIL: Mom(Cynthia) calling for patient who is at work. She states that this has happened two times. She has had a medication called in and they go to the drug store and nothing is there. She's inquiring why this keeps happening

## 2024-02-17 NOTE — Telephone Encounter (Signed)
 Spoke with patient. Advised per Lauraine Lakes, CNM to take the OTC iron  325 mg daily as discussed in previous message. Lauraine was unaware that [employer]  required PA and is usually denied. Patient verbalizes understanding.

## 2024-02-24 ENCOUNTER — Inpatient Hospital Stay
Admission: RE | Admit: 2024-02-24 | Discharge: 2024-02-24 | Disposition: A | Discharge status: Home / Self Care | Source: Ambulatory Visit | Attending: Family Medicine | Admitting: Family Medicine

## 2024-02-24 ENCOUNTER — Encounter: Payer: Self-pay | Admitting: Family Medicine

## 2024-02-24 ENCOUNTER — Emergency Department

## 2024-02-24 ENCOUNTER — Emergency Department
Admission: EM | Admit: 2024-02-24 | Discharge: 2024-02-24 | Disposition: A | Discharge status: Home / Self Care | Attending: Emergency Medicine | Admitting: Emergency Medicine

## 2024-02-24 ENCOUNTER — Other Ambulatory Visit: Payer: Self-pay

## 2024-02-24 DIAGNOSIS — Z3493 Encounter for supervision of normal pregnancy, unspecified, third trimester: Secondary | ICD-10-CM

## 2024-02-24 DIAGNOSIS — Z3A29 29 weeks gestation of pregnancy: Secondary | ICD-10-CM | POA: Diagnosis not present

## 2024-02-24 DIAGNOSIS — R519 Headache, unspecified: Secondary | ICD-10-CM

## 2024-02-24 DIAGNOSIS — O99353 Diseases of the nervous system complicating pregnancy, third trimester: Secondary | ICD-10-CM | POA: Insufficient documentation

## 2024-02-24 DIAGNOSIS — O26893 Other specified pregnancy related conditions, third trimester: Secondary | ICD-10-CM | POA: Insufficient documentation

## 2024-02-24 DIAGNOSIS — Z3400 Encounter for supervision of normal first pregnancy, unspecified trimester: Secondary | ICD-10-CM

## 2024-02-24 DIAGNOSIS — Z79899 Other long term (current) drug therapy: Secondary | ICD-10-CM | POA: Insufficient documentation

## 2024-02-24 LAB — URINALYSIS, ROUTINE W REFLEX MICROSCOPIC
Bilirubin Urine: NEGATIVE
Glucose, UA: NEGATIVE mg/dL
Hgb urine dipstick: NEGATIVE
Ketones, ur: NEGATIVE mg/dL
Leukocytes,Ua: NEGATIVE
Nitrite: NEGATIVE
Protein, ur: NEGATIVE mg/dL
Specific Gravity, Urine: 1.016 (ref 1.005–1.030)
pH: 6 (ref 5.0–8.0)

## 2024-02-24 LAB — PREGNANCY, URINE: Preg Test, Ur: POSITIVE — AB

## 2024-02-24 LAB — COMPREHENSIVE METABOLIC PANEL WITH GFR
ALT: 17 U/L (ref 0–44)
AST: 23 U/L (ref 15–41)
Albumin: 3.8 g/dL (ref 3.5–5.0)
Alkaline Phosphatase: 104 U/L (ref 38–126)
Anion gap: 10 (ref 5–15)
BUN: 7 mg/dL (ref 6–20)
CO2: 24 mmol/L (ref 22–32)
Calcium: 9.1 mg/dL (ref 8.9–10.3)
Chloride: 103 mmol/L (ref 98–111)
Creatinine, Ser: 0.52 mg/dL (ref 0.44–1.00)
GFR, Estimated: >60 mL/min
Glucose, Bld: 84 mg/dL (ref 70–99)
Potassium: 3.6 mmol/L (ref 3.5–5.1)
Sodium: 137 mmol/L (ref 135–145)
Total Bilirubin: 0.3 mg/dL (ref 0.0–1.2)
Total Protein: 6.7 g/dL (ref 6.5–8.1)

## 2024-02-24 LAB — CBC
HCT: 30.5 % — ABNORMAL LOW (ref 36.0–46.0)
Hemoglobin: 10.3 g/dL — ABNORMAL LOW (ref 12.0–15.0)
MCH: 28.8 pg (ref 26.0–34.0)
MCHC: 33.8 g/dL (ref 30.0–36.0)
MCV: 85.2 fL (ref 80.0–100.0)
Platelets: 234 K/uL (ref 150–400)
RBC: 3.58 MIL/uL — ABNORMAL LOW (ref 3.87–5.11)
RDW: 13.9 % (ref 11.5–15.5)
WBC: 7.4 K/uL (ref 4.0–10.5)
nRBC: 0 % (ref 0.0–0.2)

## 2024-02-24 LAB — RESP PANEL BY RT-PCR (RSV, FLU A&B, COVID)  RVPGX2
Influenza A by PCR: NEGATIVE
Influenza B by PCR: NEGATIVE
Resp Syncytial Virus by PCR: NEGATIVE
SARS Coronavirus 2 by RT PCR: NEGATIVE

## 2024-02-24 LAB — PROTEIN / CREATININE RATIO, URINE
Creatinine, Urine: 156 mg/dL
Protein Creatinine Ratio: 0.2 mg/mg — ABNORMAL HIGH
Total Protein, Urine: 26 mg/dL

## 2024-02-24 LAB — LIPASE, BLOOD: Lipase: 40 U/L (ref 11–51)

## 2024-02-24 MED ORDER — SODIUM CHLORIDE 0.9 % IV BOLUS (SEPSIS)
1000.0000 mL | Freq: Once | INTRAVENOUS | Status: AC
  Administered 2024-02-24: 1000 mL via INTRAVENOUS

## 2024-02-24 MED ORDER — DIPHENHYDRAMINE HCL 50 MG/ML IJ SOLN
25.0000 mg | Freq: Once | INTRAMUSCULAR | Status: AC
  Administered 2024-02-24: 25 mg via INTRAVENOUS
  Filled 2024-02-24: qty 1

## 2024-02-24 MED ORDER — ACCRUFER 30 MG PO CAPS: 1.0000 | ORAL_CAPSULE | Freq: Two times a day (BID) | ORAL | 6 refills | Status: DC

## 2024-02-24 MED ORDER — PROCHLORPERAZINE EDISYLATE 10 MG/2ML IJ SOLN
10.0000 mg | Freq: Once | INTRAMUSCULAR | Status: AC
  Administered 2024-02-24: 10 mg via INTRAVENOUS
  Filled 2024-02-24: qty 2

## 2024-02-24 MED ORDER — TRIAMCINOLONE ACETONIDE 0.5 % EX OINT: 1.0000 | TOPICAL_OINTMENT | Freq: Two times a day (BID) | CUTANEOUS | 0 refills | Status: AC

## 2024-02-24 MED ORDER — IOHEXOL 350 MG/ML SOLN
75.0000 mL | Freq: Once | INTRAVENOUS | Status: AC | PRN
  Administered 2024-02-24: 75 mL via INTRAVENOUS

## 2024-02-24 MED ORDER — IOHEXOL 350 MG/ML SOLN: 75.0000 mL | Freq: Once | INTRAVENOUS | Status: DC | PRN

## 2024-02-24 MED ORDER — KETOROLAC TROMETHAMINE 30 MG/ML IJ SOLN
30.0000 mg | Freq: Once | INTRAMUSCULAR | Status: AC
  Administered 2024-02-24: 30 mg via INTRAVENOUS
  Filled 2024-02-24: qty 1

## 2024-02-24 NOTE — ED Triage Notes (Signed)
 Pt reports she has had a headache for the past 3 days, pt vomited once just pta.

## 2024-02-24 NOTE — ED Provider Notes (Signed)
 "  [facility] Provider Note    Event Date/Time   First MD Initiated Contact with Patient 02/24/24 [phone]     (approximate)   History   Headache   HPI  Breanna Lawrence is a 33 y.o. female with history of anxiety who is currently 29 weeks and 6 days pregnant who presents to the emergency department with complaints of severe headache worse on the left side for the past day.  She denies any head injury, numbness, tingling or focal weakness.  Has tried Tylenol  at home without relief.  No fevers, cough or congestion, neck pain or neck stiffness but did have COVID about 2 weeks ago.  She denies any numbness, tingling or weakness.  Denies history of migraines.  Has never had a similar headache.  Denies any abdominal pain, contractions, vaginal bleeding, leaking fluid.   History provided by patient, mother.    Past Medical History:  Diagnosis Date   Anxiety     Past Surgical History:  Procedure Laterality Date   ESOPHAGOGASTRODUODENOSCOPY (EGD) WITH PROPOFOL  N/A 09/16/2018   Procedure: ESOPHAGOGASTRODUODENOSCOPY (EGD) WITH PROPOFOL ;  Surgeon: Toledo, Ladell POUR, MD;  Location: [facility] ENDOSCOPY;  Service: Gastroenterology;  Laterality: N/A;   OTHER SURGICAL HISTORY     Hernia, 4-5 yrs old    MEDICATIONS:  Prior to Admission medications  Medication Sig Start Date End Date Taking? Authorizing Provider  cyclobenzaprine  (FLEXERIL ) 10 MG tablet Take 1 tablet (10 mg total) by mouth 3 (three) times daily as needed for muscle spasms. Patient not taking: Reported on 02/13/2024 12/15/23   Slaughterbeck, Damien, CNM  Ferric Maltol  (ACCRUFER ) 30 MG CAPS Take 1 capsule (30 mg total) by mouth every other day. 02/14/24   Charma Domino, CNM  metroNIDAZOLE  (FLAGYL ) 500 MG tablet Take 1 tablet (500 mg total) by mouth 2 (two) times daily. Patient not taking: Reported on 02/13/2024 12/31/23   Lynda Bradley, CNM  ondansetron  (ZOFRAN -ODT) 4 MG disintegrating tablet Take 1 tablet (4 mg  total) by mouth every 8 (eight) hours as needed. Patient not taking: Reported on 02/13/2024 08/21/22   Brimage, Vondra, DO  oxyCODONE  (ROXICODONE ) 5 MG immediate release tablet Take 0.5 tablets (2.5 mg total) by mouth every 8 (eight) hours as needed. Patient not taking: Reported on 02/13/2024 12/15/23 12/14/24  Claudene Rover, MD  Prenatal Vit-Fe Fumarate-FA (PRENATAL VITAMIN PLUS LOW IRON ) 27-1 MG TABS Take 1 tablet by mouth daily. 09/12/23   Orlean Alan HERO, FNP  promethazine  (PHENERGAN ) 25 MG tablet Take 1 tablet (25 mg total) by mouth every 6 (six) hours as needed for nausea or vomiting. 10/15/23   Lynda Bradley, CNM  Vitamin D , Ergocalciferol , (DRISDOL ) 1.25 MG (50000 UNIT) CAPS capsule TAKE 1 CAPSULE (50,000 UNITS TOTAL) BY MOUTH EVERY 7 (SEVEN) DAYS 10/17/23   Orlean Alan HERO, FNP    Physical Exam   Triage Vital Signs: ED Triage Vitals  Encounter Vitals Group     BP 02/24/24 0243 111/83     Girls Systolic BP Percentile --      Girls Diastolic BP Percentile --      Boys Systolic BP Percentile --      Boys Diastolic BP Percentile --      Pulse Rate 02/24/24 0243 92     Resp 02/24/24 0243 17     Temp 02/24/24 0243 98.4 F (36.9 C)     Temp src --      SpO2 02/24/24 0243 97 %     Weight 02/24/24 0243 154  lb (69.9 kg)     Height 02/24/24 0243 5' 4 (1.626 m)     Head Circumference --      Peak Flow --      Pain Score 02/24/24 0242 9     Pain Loc --      Pain Education --      Exclude from Growth Chart --     Most recent vital signs: Vitals:   02/24/24 0243 02/24/24 0453  BP: 111/83 106/71  Pulse: 92 86  Resp: 17 18  Temp: 98.4 F (36.9 C)   SpO2: 97% 99%    CONSTITUTIONAL: Alert, responds appropriately to questions.  Appears uncomfortable, afebrile HEAD: Normocephalic, atraumatic EYES: Conjunctivae clear, pupils appear equal, sclera nonicteric ENT: normal nose; moist mucous membranes NECK: Supple, normal ROM, no meningismus CARD: RRR; S1 and S2 appreciated RESP:  Normal chest excursion without splinting or tachypnea; breath sounds clear and equal bilaterally; no wheezes, no rhonchi, no rales, no hypoxia or respiratory distress, speaking full sentences ABD/GI: Gravid uterus, nontender to palpation, no guarding or rebound BACK: The back appears normal EXT: Normal ROM in all joints; no deformity noted, no edema SKIN: Normal color for age and race; warm; no rash on exposed skin NEURO: Moves all extremities equally, normal speech, normal sensation diffusely, cranial nerves II through XII intact PSYCH: The patient's mood and manner are appropriate.   ED Results / Procedures / Treatments   LABS: (all labs ordered are listed, but only abnormal results are displayed) Labs Reviewed  CBC - Abnormal; Notable for the following components:      Result Value   RBC 3.58 (*)    Hemoglobin 10.3 (*)    HCT 30.5 (*)    All other components within normal limits  URINALYSIS, ROUTINE W REFLEX MICROSCOPIC - Abnormal; Notable for the following components:   Color, Urine YELLOW (*)    APPearance CLOUDY (*)    All other components within normal limits  PREGNANCY, URINE - Abnormal; Notable for the following components:   Preg Test, Ur POSITIVE (*)    All other components within normal limits  RESP PANEL BY RT-PCR (RSV, FLU A&B, COVID)  RVPGX2  LIPASE, BLOOD  COMPREHENSIVE METABOLIC PANEL WITH GFR  PROTEIN / CREATININE RATIO, URINE     EKG:  EKG Interpretation Date/Time:    Ventricular Rate:    PR Interval:    QRS Duration:    QT Interval:    QTC Calculation:   R Axis:      Text Interpretation:           RADIOLOGY: My personal review and interpretation of imaging: CT pending.  I have personally reviewed all radiology reports.   No results found.   PROCEDURES:  Critical Care performed: No     Procedures    IMPRESSION / MDM / ASSESSMENT AND PLAN / ED COURSE  I reviewed the triage vital signs and the nursing notes.    Patient here  with complaints of severe left-sided headache without focal neurodeficits.  She is currently pregnant.     DIFFERENTIAL DIAGNOSIS (includes but not limited to):   Cavernous sinus thrombosis, cerebral aneurysm, intracranial hemorrhage, migraine, dehydration, preeclampsia, anemia, electrolyte derangement, viral URI   Patient's presentation is most consistent with acute presentation with potential threat to life or bodily function.   PLAN: Will obtain labs, urine, viral swab, CTs of the head and neck to evaluate for cavernous sinus thrombosis, aneurysm, intracranial hemorrhage.  Will give IV fluids, pain medicine.  MEDICATIONS GIVEN IN ED: Medications  sodium chloride  0.9 % bolus 1,000 mL (0 mLs Intravenous Stopped 02/24/24 0454)  ketorolac  (TORADOL ) 30 MG/ML injection 30 mg (30 mg Intravenous Given 02/24/24 0425)  prochlorperazine  (COMPAZINE ) injection 10 mg (10 mg Intravenous Given 02/24/24 0425)  diphenhydrAMINE  (BENADRYL ) injection 25 mg (25 mg Intravenous Given 02/24/24 0447)     ED COURSE: Labs show no leukocytosis, normal electrolytes.  Urine shows no proteinuria.  Blood pressure here is normal.  COVID, flu and RSV negative.  Patient did receive a dose of Toradol  prior to me realizing that she was in her third trimester.  I did discuss this with OB/GYN on-call Harlene Cisco, CNM.  Appreciate her help.  She states that one-time dose of NSAIDs prior to 32 weeks would not be dangerous to patient or fetus or put her at risk for closure of the PDA.  She would like to monitor the patient in labor and delivery however since she is in her third trimester and having severe headaches.  Discussed this with patient and mother at bedside who verbalized understanding.  Patient did become very restless after receiving IV Compazine .  Discussed with patient and mother that this is called akathisia and can happen after Compazine  and I have listed this as an allergy in her chart.  The symptoms resolved  after a dose of IV Benadryl  and now she reports her headache is gone.  5:31 AM  Patient currently in imaging.  She will be taken up to labor and delivery immediately after CT scans have been completed.  OB/GYN team will follow-up on the results of these images and further manage patient and monitor fetus.   CONSULTS: Discussed with OB/GYN on-call.  They recommend sending patient to labor and delivery for further monitoring.   OUTSIDE RECORDS REVIEWED: Reviewed recent OB/GYN notes.       FINAL CLINICAL IMPRESSION(S) / ED DIAGNOSES   Final diagnoses:  Bad headache  Third trimester pregnancy     Rx / DC Orders   ED Discharge Orders     None        Note:  This document was prepared using Dragon voice recognition software and may include unintentional dictation errors.   Yer Olivencia, Josette SAILOR, DO 02/24/24 0532  "

## 2024-02-24 NOTE — OB Triage Note (Signed)
 Discharge instruction reviewed with patient . Patient verbalizes understanding. Patient educated on the need to report  any discomfort and abnormalities from her usual . Prescription reviewed and sent with patient .

## 2024-02-24 NOTE — OB Triage Note (Signed)
 Pt is a G1P0 at 109w6d gestation presenting to l/d triage for an NST after being seen in the ED.   Patient denies LOF, VB, & CTX. She endorses+FM.  Monitors applied and assessing with initial fht 150 at 0551.  VSS Breanna Lawrence CNM aware of pt arrival to unit.

## 2024-02-24 NOTE — Discharge Summary (Addendum)
 LABOR & DELIVERY OB TRIAGE NOTE  SUBJECTIVE  HPI Breanna Lawrence is a 33 y.o. G1P0000 at [redacted]w[redacted]d who presents to Labor & Delivery from ED for headache x3 days. She presented to ED and was evaluated for headache, provider in ED was not aware of patient's rpegnancy, she received 30mg  toradol  IV x1, as well as a dose of compazine . She experienced akathisia after compazine  administered & then received IV benadryl . On arrival to L&D triage she is reports headache is resolved. Rouses easily but quickly falls back to sleep. Mother & father at bedside. Endorses fetal movement, denies contractions, cramping or pain.  OB History     Gravida  1   Para  0   Term  0   Preterm  0   AB  0   Living  0      SAB  0   IAB  0   Ectopic  0   Multiple  0   Live Births  0             OBJECTIVE  BP 111/69   Pulse 87   Temp 98.5 F (36.9 C) (Oral)   Resp 16   Ht 5' 4 (1.626 m)   Wt 68 kg   LMP 07/18/2023 (Approximate) Comment: had cycle in May but isn't sure of dates and says it was not a normal level.  SpO2 99%   BMI 25.75 kg/m   General: rouses easily, NAD Heart: regular rate Lungs: normal work of breathing Abdomen: gravid, soft, nontender Cervical exam:   deferred  NST I reviewed the NST and it was reactive.  Baseline: 150 Variability: moderate Accelerations: present, 10x10 Decelerations:none Toco: none Category I  Results for orders placed or performed during the hospital encounter of 02/24/24 (from the past 24 hours)  Lipase, blood     Status: None   Collection Time: 02/24/24  2:45 AM  Result Value Ref Range   Lipase 40 11 - 51 U/L  Comprehensive metabolic panel     Status: None   Collection Time: 02/24/24  2:45 AM  Result Value Ref Range   Sodium 137 135 - 145 mmol/L   Potassium 3.6 3.5 - 5.1 mmol/L   Chloride 103 98 - 111 mmol/L   CO2 24 22 - 32 mmol/L   Glucose, Bld 84 70 - 99 mg/dL   BUN 7 6 - 20 mg/dL   Creatinine, Ser 9.47 0.44 - 1.00 mg/dL    Calcium 9.1 8.9 - 89.6 mg/dL   Total Protein 6.7 6.5 - 8.1 g/dL   Albumin 3.8 3.5 - 5.0 g/dL   AST 23 15 - 41 U/L   ALT 17 0 - 44 U/L   Alkaline Phosphatase 104 38 - 126 U/L   Total Bilirubin 0.3 0.0 - 1.2 mg/dL   GFR, Estimated >39 >39 mL/min   Anion gap 10 5 - 15  CBC     Status: Abnormal   Collection Time: 02/24/24  2:45 AM  Result Value Ref Range   WBC 7.4 4.0 - 10.5 K/uL   RBC 3.58 (L) 3.87 - 5.11 MIL/uL   Hemoglobin 10.3 (L) 12.0 - 15.0 g/dL   HCT 69.4 (L) 63.9 - 53.9 %   MCV 85.2 80.0 - 100.0 fL   MCH 28.8 26.0 - 34.0 pg   MCHC 33.8 30.0 - 36.0 g/dL   RDW 86.0 88.4 - 84.4 %   Platelets 234 150 - 400 K/uL   nRBC 0.0 0.0 - 0.2 %  Urinalysis, Routine w reflex microscopic -Urine, Clean Catch     Status: Abnormal   Collection Time: 02/24/24  3:06 AM  Result Value Ref Range   Color, Urine YELLOW (A) YELLOW   APPearance CLOUDY (A) CLEAR   Specific Gravity, Urine 1.016 1.005 - 1.030   pH 6.0 5.0 - 8.0   Glucose, UA NEGATIVE NEGATIVE mg/dL   Hgb urine dipstick NEGATIVE NEGATIVE   Bilirubin Urine NEGATIVE NEGATIVE   Ketones, ur NEGATIVE NEGATIVE mg/dL   Protein, ur NEGATIVE NEGATIVE mg/dL   Nitrite NEGATIVE NEGATIVE   Leukocytes,Ua NEGATIVE NEGATIVE  Resp panel by RT-PCR (RSV, Flu A&B, Covid) Anterior Nasal Swab     Status: None   Collection Time: 02/24/24  3:06 AM   Specimen: Anterior Nasal Swab  Result Value Ref Range   SARS Coronavirus 2 by RT PCR NEGATIVE NEGATIVE   Influenza A by PCR NEGATIVE NEGATIVE   Influenza B by PCR NEGATIVE NEGATIVE   Resp Syncytial Virus by PCR NEGATIVE NEGATIVE  Pregnancy, urine     Status: Abnormal   Collection Time: 02/24/24  3:06 AM  Result Value Ref Range   Preg Test, Ur POSITIVE (A) NEGATIVE  Protein / creatinine ratio, urine     Status: Abnormal   Collection Time: 02/24/24  3:06 AM  Result Value Ref Range   Creatinine, Urine 156 mg/dL   Total Protein, Urine 26 mg/dL   Protein Creatinine Ratio 0.2 (H) <0.2 mg/mg    CT ANGIO HEAD  NECK W WO CM Result Date: 02/24/2024 EXAM: CTA Head and Neck with Intravenous Contrast. CT Head without Contrast. CLINICAL HISTORY: 33 year old female with persistent headache and vomiting. TECHNIQUE: Axial CTA images of the head and neck performed with intravenous contrast. MIP reconstructed images were created and reviewed. Axial computed tomography images of the head/brain performed without intravenous contrast. Note: Per PQRS, the description of internal carotid artery percent stenosis, including 0 percent or normal exam, is based on North American Symptomatic Carotid Endarterectomy Trial (NASCET) criteria. Dose reduction technique was used including one or more of the following: automated exposure control, adjustment of mA and kV according to patient size, and/or iterative reconstruction. CONTRAST: With; 75 mL iohexol  (OMNIPAQUE ) 350 MG/ML injection. COMPARISON: CT head 12/01/2021 and earlier. FINDINGS: CT HEAD: BRAIN: Brain volume is stable and within normal limits. White matter differentiation appears stable and within normal limits. No acute intraparenchymal hemorrhage. No mass lesion. No CT evidence for acute territorial infarct. No midline shift or extra-axial collection. VENTRICLES: No hydrocephalus. ORBITS: The orbits are unremarkable. SINUSES AND MASTOIDS: Mild paranasal sinus mucosal thickening appears chronic and stable. Visible tympanic cavities and mastoids are well aerated. CTA NECK: Incompletely visible aortic arch with mild pulsation artifact. Evidence of normal 3 vessel arch configuration. COMMON CAROTID ARTERIES: Right carotid is otherwise normal. Left carotid appears patent and normal. No stenosis. No dissection or occlusion. INTERNAL CAROTID ARTERIES: Normal right ophthalmic and posterior communicating artery origins. Normal left ophthalmic and posterior communicating artery origins. No stenosis by NASCET criteria. No dissection or occlusion. VERTEBRAL ARTERIES: Proximal right subclavian  artery and the visible right vertebral artery appear codominant, patent, and normal to the vertebrobasilar junction. Normal right PICA origin. Proximal left subclavian artery and left vertebral artery are normal. Normal left PICA origin. No stenosis. No dissection or occlusion. CTA HEAD: ANTERIOR CEREBRAL ARTERIES: Normal ACA origins. Normal anterior communicating artery. No stenosis. No occlusion. No aneurysm. MIDDLE CEREBRAL ARTERIES: Normal MCA origins. No stenosis. No occlusion. No aneurysm. POSTERIOR CEREBRAL ARTERIES: Both posterior  communicating arteries are present. Normal PCA origins. No stenosis. No occlusion. No aneurysm. BASILAR ARTERY: No  stenosis. No occlusion. No aneurysm. INTRACRANIAL VENOUS: Major dural venous sinuses are enhancing and appear patent. OTHER: CTA findings negative visible upper chest. SOFT TISSUES: No acute finding. BONES: No acute osseous abnormality. IMPRESSION: 1. Normal CTA head and neck when allowing for contrast streak artifact at the right thoracic inlet. 2. Normal CT appearance of the brain. Electronically signed by: Helayne Hurst MD 02/24/2024 06:03 AM EST RP Workstation: [id number]    ASSESSMENT Impression  1) Pregnancy at G1P0000, [redacted]w[redacted]d, Estimated Date of Delivery: 05/05/24 2) Reassuring maternal/fetal status 3) Headache in pregnancy, resolved 4) No evidence of pre-eclampsia  PLAN Discharge home with preterm labor & fetal movement precautions. Reassurance provided regarding one time dose of toradol  received in ED. Maintain next appointment as scheduled 12/29. Harlene LITTIE Cisco, CNM 02/24/2024  7:47 AM

## 2024-02-24 NOTE — Discharge Instructions (Signed)
 You are being evaluated for severe headache.  Your lab work, urine today were reassuring.  Your COVID, flu and RSV tests are negative.  Imaging of your brain is currently pending and can be followed up by the OB/GYN team in labor and delivery.

## 2024-02-24 NOTE — ED Notes (Signed)
 Pt called out reporting feeling jittery; Ward, Do made aware, see MAR.

## 2024-03-01 ENCOUNTER — Encounter: Admitting: Registered Nurse

## 2024-03-01 ENCOUNTER — Ambulatory Visit: Admitting: Obstetrics

## 2024-03-01 ENCOUNTER — Encounter: Payer: Self-pay | Admitting: Obstetrics

## 2024-03-01 ENCOUNTER — Ambulatory Visit (INDEPENDENT_AMBULATORY_CARE_PROVIDER_SITE_OTHER)

## 2024-03-01 VITALS — BP 94/66 | HR 77 | Wt 154.0 lb

## 2024-03-01 DIAGNOSIS — Z3A30 30 weeks gestation of pregnancy: Secondary | ICD-10-CM | POA: Diagnosis not present

## 2024-03-01 DIAGNOSIS — Z3400 Encounter for supervision of normal first pregnancy, unspecified trimester: Secondary | ICD-10-CM

## 2024-03-01 DIAGNOSIS — D259 Leiomyoma of uterus, unspecified: Secondary | ICD-10-CM | POA: Diagnosis not present

## 2024-03-01 DIAGNOSIS — O34593 Maternal care for other abnormalities of gravid uterus, third trimester: Secondary | ICD-10-CM

## 2024-03-01 NOTE — Assessment & Plan Note (Signed)
-  Growth scan today. Preliminary results show normal growth and AFI. -Link sent to CBE/hospital tour via MyChart -Reviewed kick counts and preterm labor warning signs. Instructed to call office or come to hospital with persistent headache, vision changes, regular contractions, leaking of fluid, decreased fetal movement or vaginal bleeding.

## 2024-03-01 NOTE — Progress Notes (Signed)
" ° ° °  Return Prenatal Note   Assessment/Plan   Plan  33 y.o. G1P0000 at [redacted]w[redacted]d presents for follow-up OB visit. Reviewed prenatal record including previous visit note.  Fibroid uterus -Preliminary US  report shows stable fibroid: 4.9 cm x 3.8 cm x 4.6 cm (was 4.8 x 4.0 x 4.5 cm at 20 w) -Breanna Lawrence has been planning a CS but is now interested in vaginal birth. Will schedule visit with MD to review finalized US  report and discuss recommended mode of delivery.  Supervision of normal pregnancy -Growth scan today. Preliminary results show normal growth and AFI. -Link sent to CBE/hospital tour via MyChart -Reviewed kick counts and preterm labor warning signs. Instructed to call office or come to hospital with persistent headache, vision changes, regular contractions, leaking of fluid, decreased fetal movement or vaginal bleeding.     No orders of the defined types were placed in this encounter.  Return in about 2 weeks (around 03/15/2024).   Future Appointments  Date Time Provider Department Center  03/17/2024  8:35 AM Breanna Sober, MD [facility] None    For next visit:  Routine prenatal care    Subjective  Breanna Lawrence had an US  today to f/u on fibroids. She had been planning an elective primary CS, but would like to discuss a vaginal birth depending on final US  report. She was seen in the ED for a severe HA recently. She has not had any issues since then. She plans to take the baby to [facility].  Movement: Present Contractions: Not present  Objective   Flow sheet Vitals: Pulse Rate: 77 BP: 94/66 Fundal Height: 31 cm Fetal Heart Rate (bpm): 145 Total weight gain: 33 lb (15 kg)  General Appearance  No acute distress, well appearing, and well nourished Pulmonary   Normal work of breathing Neurologic   Alert and oriented to person, place, and time Psychiatric   Mood and affect within normal limits  Breanna Lawrence, CNM 03/01/2024 12:10 PM  "

## 2024-03-01 NOTE — Assessment & Plan Note (Addendum)
-  Preliminary US  report shows stable fibroid: 4.9 cm x 3.8 cm x 4.6 cm (was 4.8 x 4.0 x 4.5 cm at 20 w) -Breanna Lawrence has been planning a CS but is now interested in vaginal birth. Will schedule visit with MD to review finalized US  report and discuss recommended mode of delivery.

## 2024-03-15 NOTE — Progress Notes (Signed)
" ° ° °  Return Prenatal Note   Subjective  34 y.o. G1P0000 at [redacted]w[redacted]d presents for this follow-up prenatal visit.   Patient has a 4.9cmx3.8cmx4.6cm fibroid near cervix.  Originally declined vaginal delivery and requested a primary elective c-section due to fibroid.  She has changed her mind and would like to have a vaginal delivery.  Last ultrasound was done at [redacted]w[redacted]d on 03/01/24.  Patient reports: Movement: Present Contractions: Irregular Denies vaginal bleeding or leaking fluid. Objective  Flow sheet Vitals: Pulse Rate: 93 BP: 105/79 Fundal Height: 33 cm Fetal Heart Rate (bpm): 150 Total weight gain: 34 lb 14.4 oz (15.8 kg)  General Appearance  No acute distress, well appearing, and well nourished Pulmonary   Normal work of breathing Neurologic   Alert and oriented to person, place, and time Psychiatric   Mood and affect within normal limits  Ultrasound 03/01/24 Indications:Evaluate size of Uterine Fibroid w/growth of pregnancy.   Findings:  Singleton intrauterine pregnancy is visualized with FHR at 160 bpm.    Biometrics gives an (U/S) Gestational age of [redacted]w[redacted]d and an (U/S) EDD of 05/07/24; this correlates with the clinically established Estimated Date of Delivery: 05/05/24.    Fetal presentation is Cephalic.  Placenta: posterior.   AFI: 13.08 cm   Growth is in the  30.9th percentile   AC is in the 49.2th percentile. EFW: 1583g, 3lb 8oz   Impression: 1. [redacted]w[redacted]d Viable Single Intrauterine pregnancy dated by previously established criteria. 2. Growth is 30.9th percentile. AFI is 13.08 cm. 3. Uterine Fibroid was again seen and measured posteriorly: 4.9 x 3.8 x 4.6.   Recommendations: 1.Clinical correlation with the patient's History and Physical Exam.  Assessment/Plan   Plan  34 y.o. G1P0000 at [redacted]w[redacted]d presents for follow-up OB visit. Reviewed prenatal record including previous visit note.  1. Supervision of normal first pregnancy, antepartum (Primary) - Ferric Maltol   (ACCRUFER ) 30 MG CAPS; Take 1 capsule (30 mg total) by mouth in the morning and at bedtime. (Patient not taking: Reported on 03/16/2024)  Dispense: 60 capsule; Refill: 6 - Prenatal Vit-Fe Fumarate-FA (PRENATAL VITAMIN PLUS LOW IRON ) 27-1 MG TABS; Take 1 tablet by mouth daily.  Dispense: 30 tablet; Refill: 3 - US  OB Follow Up; Future  2. Screening, anemia, deficiency, iron  - Ferric Maltol  (ACCRUFER ) 30 MG CAPS; Take 1 capsule (30 mg total) by mouth in the morning and at bedtime. (Patient not taking: Reported on 03/16/2024)  Dispense: 60 capsule; Refill: 6  3. Uterine leiomyoma, unspecified location - US  OB Follow Up; Future   Orders Placed This Encounter  Procedures   US  OB Follow Up    Standing Status:   Future    Expected Date:   03/30/2024    Expiration Date:   03/16/2025    Reason for Exam (SYMPTOM  OR DIAGNOSIS REQUIRED):   Growth US  and fibroid eval    Preferred Imaging Location?:   Internal   Return in about 2 weeks (around 03/30/2024) for ROB w/Dr. Leigh - delivery planning; Growth US /fibroid eval same day.   Future Appointments  Date Time Provider Department Center  03/31/2024 10:15 AM [facility] US  1 [facility] None  03/31/2024  2:15 PM Leigh Sober, MD [facility] None    For next visit:  continue with routine prenatal care, start iron       Sober Leigh, DO [facility]   "

## 2024-03-16 ENCOUNTER — Ambulatory Visit: Admitting: Obstetrics

## 2024-03-16 ENCOUNTER — Other Ambulatory Visit: Payer: Self-pay | Admitting: Family

## 2024-03-16 VITALS — BP 105/79 | HR 93 | Wt 155.9 lb

## 2024-03-16 DIAGNOSIS — Z130 Encounter for screening for diseases of the blood and blood-forming organs and certain disorders involving the immune mechanism: Secondary | ICD-10-CM

## 2024-03-16 DIAGNOSIS — O34593 Maternal care for other abnormalities of gravid uterus, third trimester: Secondary | ICD-10-CM | POA: Diagnosis not present

## 2024-03-16 DIAGNOSIS — Z3A32 32 weeks gestation of pregnancy: Secondary | ICD-10-CM | POA: Diagnosis not present

## 2024-03-16 DIAGNOSIS — Z3400 Encounter for supervision of normal first pregnancy, unspecified trimester: Secondary | ICD-10-CM

## 2024-03-16 DIAGNOSIS — D259 Leiomyoma of uterus, unspecified: Secondary | ICD-10-CM

## 2024-03-16 MED ORDER — ACCRUFER 30 MG PO CAPS: 1.0000 | ORAL_CAPSULE | Freq: Two times a day (BID) | ORAL | 6 refills | Status: AC

## 2024-03-16 MED ORDER — PRENATAL VITAMIN PLUS LOW IRON 27-1 MG PO TABS: 1.0000 | ORAL_TABLET | Freq: Every day | ORAL | 3 refills | Status: AC

## 2024-03-17 ENCOUNTER — Encounter: Admitting: Obstetrics

## 2024-03-18 ENCOUNTER — Other Ambulatory Visit

## 2024-03-18 ENCOUNTER — Other Ambulatory Visit: Payer: Self-pay

## 2024-03-18 DIAGNOSIS — Z3400 Encounter for supervision of normal first pregnancy, unspecified trimester: Secondary | ICD-10-CM

## 2024-03-18 DIAGNOSIS — Z130 Encounter for screening for diseases of the blood and blood-forming organs and certain disorders involving the immune mechanism: Secondary | ICD-10-CM

## 2024-03-18 NOTE — Progress Notes (Signed)
 Orders for iron  panel.

## 2024-03-19 LAB — CBC
Hematocrit: 32.4 % — ABNORMAL LOW (ref 34.0–46.6)
Hemoglobin: 10.5 g/dL — ABNORMAL LOW (ref 11.1–15.9)
MCH: 29.2 pg (ref 26.6–33.0)
MCHC: 32.4 g/dL (ref 31.5–35.7)
MCV: 90 fL (ref 79–97)
Platelets: 206 x10E3/uL (ref 150–450)
RBC: 3.6 x10E6/uL — ABNORMAL LOW (ref 3.77–5.28)
RDW: 14.8 % (ref 11.7–15.4)
WBC: 5.8 x10E3/uL (ref 3.4–10.8)

## 2024-03-19 LAB — IRON,TIBC AND FERRITIN PANEL
Ferritin: 32 ng/mL (ref 15–150)
Iron Saturation: 11 % — ABNORMAL LOW (ref 15–55)
Iron: 47 ug/dL (ref 27–159)
Total Iron Binding Capacity: 429 ug/dL (ref 250–450)
UIBC: 382 ug/dL (ref 131–425)

## 2024-03-26 ENCOUNTER — Other Ambulatory Visit (HOSPITAL_COMMUNITY): Payer: Self-pay

## 2024-03-31 ENCOUNTER — Ambulatory Visit: Admitting: Obstetrics

## 2024-03-31 ENCOUNTER — Encounter: Payer: Self-pay | Admitting: Obstetrics

## 2024-03-31 ENCOUNTER — Ambulatory Visit

## 2024-03-31 VITALS — BP 100/68 | HR 111 | Wt 157.0 lb

## 2024-03-31 DIAGNOSIS — F1290 Cannabis use, unspecified, uncomplicated: Secondary | ICD-10-CM | POA: Diagnosis not present

## 2024-03-31 DIAGNOSIS — Z3A34 34 weeks gestation of pregnancy: Secondary | ICD-10-CM | POA: Diagnosis not present

## 2024-03-31 DIAGNOSIS — Z3A35 35 weeks gestation of pregnancy: Secondary | ICD-10-CM | POA: Diagnosis not present

## 2024-03-31 DIAGNOSIS — Z2911 Encounter for prophylactic immunotherapy for respiratory syncytial virus (RSV): Secondary | ICD-10-CM

## 2024-03-31 DIAGNOSIS — Z3400 Encounter for supervision of normal first pregnancy, unspecified trimester: Secondary | ICD-10-CM

## 2024-03-31 DIAGNOSIS — Z3403 Encounter for supervision of normal first pregnancy, third trimester: Secondary | ICD-10-CM

## 2024-03-31 DIAGNOSIS — D259 Leiomyoma of uterus, unspecified: Secondary | ICD-10-CM

## 2024-03-31 DIAGNOSIS — O3413 Maternal care for benign tumor of corpus uteri, third trimester: Secondary | ICD-10-CM

## 2024-03-31 DIAGNOSIS — Z23 Encounter for immunization: Secondary | ICD-10-CM | POA: Diagnosis not present

## 2024-03-31 DIAGNOSIS — Z01818 Encounter for other preprocedural examination: Secondary | ICD-10-CM

## 2024-03-31 DIAGNOSIS — O99323 Drug use complicating pregnancy, third trimester: Secondary | ICD-10-CM

## 2024-03-31 NOTE — Patient Instructions (Signed)
 Third Trimester of Pregnancy  The third trimester of pregnancy is from week 28 through week 40. This is months 7 through 9. The third trimester is a time when your baby is growing fast. Body changes during your third trimester Your body continues to change during this time. The changes usually go away after your baby is born. Physical changes You will continue to gain weight. You may get stretch marks on your hips, belly, and breasts. Your breasts will keep growing and may hurt. A yellow fluid (colostrum) may leak from your breasts. This is the first milk you're making for your baby. Your hair may grow faster and get thicker. In some cases, you may get hair loss. Your belly button may stick out. You may have more swelling in your hands, face, or ankles. Health changes You may have heartburn. You may feel short of breath. This is caused by the uterus that is now bigger. You may have more aches in the pelvis, back, or thighs. You may have more tingling or numbness in your hands, arms, and legs. You may pee more often. You may have trouble pooping (constipation) or swollen veins in the butt that can itch or get painful (hemorrhoids). Other changes You may have more problems sleeping. You may notice the baby moving lower in your belly (dropping). You may have more fluid coming from your vagina. Your joints may feel loose, and you may have pain around your pelvic bone. Follow these instructions at home: Medicines Take medicines only as told by your health care provider. Some medicines are not safe during pregnancy. Your provider may change the medicines that you take. Do not take any medicines unless told to by your provider. Take a prenatal vitamin that has at least 600 micrograms (mcg) of folic acid. Do not use herbal medicines, illegal drugs, or medicines that are not approved by your provider. Eating and drinking While you're pregnant your body needs additional nutrition to help  support your growing baby. Talk with your provider about your nutritional needs. Activity Most women are able to exercise regularly during pregnancy. Exercise routines may need to change at the end of your pregnancy. Talk to your provider about your activities and exercise routine. Relieving pain and discomfort Rest often with your legs raised if you have leg cramps or low back pain. Take warm sitz baths to soothe pain from hemorrhoids. Use hemorrhoid cream if your provider says it's okay. Wear a good, supportive bra if your breasts hurt. Do not use hot tubs, steam rooms, or saunas. Do not douche. Do not use tampons or scented pads. Safety Talk to your provider before traveling far distances. Wear your seatbelt at all times when you're in a car. Talk to your provider if someone hits you, hurts you, or yells at you. Preparing for birth To prepare for your baby: Take childbirth and breastfeeding classes. Visit the hospital and tour the maternity area. Buy a rear-facing car seat. Learn how to install it in your car. General instructions Avoid cat litter boxes and soil used by cats. These things carry germs that can cause harm to your pregnancy and your baby. Do not drink alcohol, smoke, vape, or use products with nicotine or tobacco in them. If you need help quitting, talk with your provider. Keep all follow-up visits for your third trimester. Your provider will do more exams and tests during this trimester. Write down your questions. Take them to your prenatal visits. Your provider also will: Talk with you about  your overall health. Give you advice or refer you to specialists who can help with different needs, including: Mental health and counseling. Foods and healthy eating. Ask for help if you need help with food. Where to find more information American Pregnancy Association: americanpregnancy.org [employer]necologists: acog.org Office on [employer] Health:  [employer] Contact a health care provider if: You have a headache that does not go away when you take medicine. You have any of these problems: You can't eat or drink. You have nausea and vomiting. You have watery poop (diarrhea) for 2 days or more. You have pain when you pee, or your pee smells bad. You have been sick for 2 days or more and aren't getting better. Contact your provider right away if: You have any of these coming from your vagina: Abnormal discharge. Bad-smelling fluid. Bleeding. Your baby is moving less than usual. You have signs of labor: You have any contractions, belly cramping, or have pain in your pelvis or lower back before 37 weeks of pregnancy (preterm labor). You have regular contractions that are less than 5 minutes apart. Your water breaks. You have symptoms of high blood pressure or preeclampsia. These include: A severe, throbbing headache that does not go away. Sudden or extreme swelling of your face, hands, legs, or feet. Vision problems: You see spots. You have blurry vision. Your eyes are sensitive to light. If you can't reach your provider, go to an urgent care or emergency room. Get help right away if: You faint, become confused, or can't think clearly. You have chest pain or trouble breathing. You have any kind of injury, such as from a fall or a car crash. These symptoms may be an emergency. Call 911 right away. Do not wait to see if the symptoms will go away. Do not drive yourself to the hospital. This information is not intended to replace advice given to you by your health care provider. Make sure you discuss any questions you have with your health care provider. Document Revised: 11/21/2022 Document Reviewed: 06/21/2022 Elsevier Patient Education  2024 [employer].

## 2024-03-31 NOTE — Progress Notes (Signed)
 "   Return Prenatal Note   Subjective  34 y.o. G1P0000 at [redacted]w[redacted]d presents for this follow-up prenatal visit.   Patient is here with her two sisters. Would like RSV today.  She has been taking her iron  once daily in addition to her prenatal vitamin.    Patient has a 4.9cmx3.8cmx4.6cm fibroid near cervix.  Originally declined vaginal delivery and requested a primary elective c-section due to fibroid.  At her last visit, 32wks, she changed her mind and would like to have a vaginal delivery.  Today she states has decided she wants a c-section for certain and would like to discuss and schedule. Last ultrasound was done today and fibroid was again seen close to cervix and posterior: 4.6 x 3.6 x 3.8 cm.   Patient reports: Movement: Present Contractions: Irregular Denies vaginal bleeding or leaking fluid. Objective  Flow sheet Vitals: Pulse Rate: (!) 111 BP: 100/68 Fundal Height: 35 cm Fetal Heart Rate (bpm): 141 Total weight gain: 36 lb (16.3 kg)  General Appearance  No acute distress, well appearing, and well nourished Pulmonary   Normal work of breathing Neurologic   Alert and oriented to person, place, and time Psychiatric   Mood and affect within normal limits  PRELIM REPORT Location: [facility] Date of Service: 03/31/2024  Ordering Provider: Estil Mangle, MD   Indications:Growth/Evaluate Uterine Fibroid.   Findings:  Singleton intrauterine pregnancy is visualized with FHR at 153 bpm.    Robley gives an (U/S) Gestational age of [redacted]w[redacted]d and an (U/S) EDD of 05/06/24; this correlates with the clinically established Estimated Date of Delivery: 05/05/24.    Fetal presentation is Cephalic.  Placenta: posterior.   AFI: 13.23 cm   Growth is in the  46.0th percentile   AC is in the 59.1th percentile. EFW: 2559g, 5lb 10oz   Impression: 1. [redacted]w[redacted]d Viable Single Intrauterine pregnancy dated by previously established criteria. 2. Growth is 46.0th percentile. AFI is 13.23  cm. 3. Uterine Fibroid was again seen close to Cervix and Posterior: 4.6 x 3.6 x 3.8 cm.   Recommendations: 1.Clinical correlation with the patient's History and Physical Exam.   Berwyn DELENA Hummer, RDMS (AB,OB,BR),RVT  Assessment/Plan   Plan  34 y.o. G1P0000 at [redacted]w[redacted]d by 9wk US  presents for follow-up OB visit. Reviewed prenatal record including previous visit note.  1. Encounter for supervision of normal first pregnancy in third trimester (Primary) -RSV vaccine done today -PTL precautions reviewed  2. Fibroid of cervix -Long discussion today re: mode of delivery. Pt is certain that she wants a primary cesarean due to the fibroid near her cervix. We reviewed decreased maternal risks a/w vaginal delivery vs cesarean and emphasized the fibroid may not interfere with fetal descent and delivery. Pt still desires cesarean delivery and accepts risks of surgery.  -Scheduled for 04/29/24 at 730 -Pt's mother will be her OR support person -Plan for clear drape, long cord, and ON-Q  3. Marijuana use during pregnancy - Cessation encouraged, plan to check UDS on admission   Orders Placed This Encounter  Procedures   Respiratory syncytial virus vaccine, preF, subunit, bivalent,(Abrysvo)   Return in about 1 week (around 04/07/2024) for ROB.   Future Appointments  Date Time Provider Department Center  04/07/2024  3:55 PM Mangle Estil, MD [facility] None  04/14/2024  8:55 AM Justino Eleanor HERO, CNM [facility] None  04/21/2024  3:35 PM Jayne Harlene CROME, CNM [facility] None  04/28/2024 11:15 AM Charma Domino, CNM [facility] None    For next visit:  ROB with GBS screening    Estil Mangle, DO [facility] "

## 2024-04-07 ENCOUNTER — Ambulatory Visit: Admitting: Obstetrics

## 2024-04-07 ENCOUNTER — Other Ambulatory Visit (HOSPITAL_COMMUNITY)
Admission: RE | Admit: 2024-04-07 | Discharge: 2024-04-07 | Disposition: A | Source: Ambulatory Visit | Attending: Obstetrics | Admitting: Obstetrics

## 2024-04-07 VITALS — BP 110/65 | HR 95 | Wt 156.6 lb

## 2024-04-07 DIAGNOSIS — Z3400 Encounter for supervision of normal first pregnancy, unspecified trimester: Secondary | ICD-10-CM

## 2024-04-07 DIAGNOSIS — Z3685 Encounter for antenatal screening for Streptococcus B: Secondary | ICD-10-CM

## 2024-04-07 DIAGNOSIS — Z113 Encounter for screening for infections with a predominantly sexual mode of transmission: Secondary | ICD-10-CM

## 2024-04-07 NOTE — Progress Notes (Signed)
" ° ° °  Return Prenatal Note   Subjective  34 y.o. G1P0000 at [redacted]w[redacted]d presents for this follow-up prenatal visit.   Pt alone today, doing well. No concerns. Question about c-section time, received MyChart message and called the number, but they weren't able to answer her questions.   Patient reports: Movement: Present Contractions: Not present Denies vaginal bleeding or leaking fluid. Objective  Flow sheet Vitals: Pulse Rate: 95 BP: 110/65 Fundal Height: 36 cm Fetal Heart Rate (bpm): 163 Presentation: Vertex (Leopold's) Total weight gain: 35 lb 9.6 oz (16.1 kg)  General Appearance  No acute distress, well appearing, and well nourished Pulmonary   Normal work of breathing Neurologic   Alert and oriented to person, place, and time Psychiatric   Mood and affect within normal limits   Assessment/Plan   Plan  34 y.o. G1P0000 at [redacted]w[redacted]d by 9wk US  presents for follow-up OB visit. Reviewed prenatal record including previous visit note.  1. Encounter for supervision of normal first pregnancy in third trimester (Primary) -GBS and GC/CT swabs done today -PTL precautions reviewed  2. Fibroid of cervix -Pt still desires cesarean delivery and accepts risks of surgery.  -Scheduled for 04/29/24 -- time is 730, arrive 530 -Pt's mother will be her OR support person -Plan for clear drape, long cord, and ON-Q  3. Marijuana use during pregnancy - Cessation encouraged, plan to check UDS on admission   Orders Placed This Encounter  Procedures   Strep Gp B NAA   Return in about 1 week (around 04/14/2024) for ROB.   Future Appointments  Date Time Provider Department Center  04/14/2024  8:55 AM Justino Eleanor HERO, CNM [facility] None  04/21/2024  3:35 PM Jayne Harlene CROME, CNM [facility] None    For next visit:  continue with routine prenatal care   Estil Mangle, DO [facility] "

## 2024-04-09 LAB — CERVICOVAGINAL ANCILLARY ONLY
Chlamydia: NEGATIVE
Comment: NEGATIVE
Comment: NORMAL
Neisseria Gonorrhea: NEGATIVE

## 2024-04-14 ENCOUNTER — Encounter: Admitting: Obstetrics

## 2024-04-21 ENCOUNTER — Encounter: Admitting: Certified Nurse Midwife

## 2024-04-28 ENCOUNTER — Encounter: Admitting: Registered Nurse

## 2024-04-29 ENCOUNTER — Inpatient Hospital Stay: Admit: 2024-04-29 | Admitting: Obstetrics
